# Patient Record
Sex: Female | Born: 1965 | Race: Black or African American | Hispanic: No | Marital: Single | Smoking: Never smoker
Health system: Southern US, Community
[De-identification: ages and names within clinical notes are randomized; demographics above are authoritative.]

## PROBLEM LIST (undated history)

## (undated) DIAGNOSIS — I10 Essential (primary) hypertension: Secondary | ICD-10-CM

## (undated) DIAGNOSIS — J40 Bronchitis, not specified as acute or chronic: Secondary | ICD-10-CM

## (undated) DIAGNOSIS — B029 Zoster without complications: Secondary | ICD-10-CM

## (undated) DIAGNOSIS — M25471 Effusion, right ankle: Secondary | ICD-10-CM

## (undated) DIAGNOSIS — J302 Other seasonal allergic rhinitis: Secondary | ICD-10-CM

## (undated) DIAGNOSIS — E785 Hyperlipidemia, unspecified: Secondary | ICD-10-CM

## (undated) DIAGNOSIS — J45909 Unspecified asthma, uncomplicated: Secondary | ICD-10-CM

## (undated) HISTORY — DX: Zoster without complications: B02.9

## (undated) HISTORY — PX: KNEE SURGERY: SHX244

## (undated) HISTORY — PX: LAPAROSCOPIC GASTRIC BANDING: SHX1100

## (undated) HISTORY — DX: Hyperlipidemia, unspecified: E78.5

## (undated) HISTORY — PX: ABDOMINAL HYSTERECTOMY: SHX81

## (undated) HISTORY — DX: Unspecified asthma, uncomplicated: J45.909

## (undated) HISTORY — PX: FOOT SURGERY: SHX648

---

## 2009-09-07 ENCOUNTER — Emergency Department (HOSPITAL_BASED_OUTPATIENT_CLINIC_OR_DEPARTMENT_OTHER): Admission: EM | Admit: 2009-09-07 | Discharge: 2009-09-07 | Payer: Self-pay | Admitting: Emergency Medicine

## 2009-09-07 ENCOUNTER — Ambulatory Visit: Payer: Self-pay | Admitting: Diagnostic Radiology

## 2011-10-14 ENCOUNTER — Other Ambulatory Visit: Payer: Self-pay | Admitting: Emergency Medicine

## 2011-10-14 DIAGNOSIS — M79604 Pain in right leg: Secondary | ICD-10-CM

## 2011-10-15 ENCOUNTER — Ambulatory Visit
Admission: RE | Admit: 2011-10-15 | Discharge: 2011-10-15 | Disposition: A | Discharge status: Home / Self Care | Payer: PRIVATE HEALTH INSURANCE | Source: Ambulatory Visit | Attending: Emergency Medicine | Admitting: Emergency Medicine

## 2011-10-15 DIAGNOSIS — M79604 Pain in right leg: Secondary | ICD-10-CM

## 2011-11-18 ENCOUNTER — Other Ambulatory Visit: Payer: Self-pay | Admitting: Podiatry

## 2011-11-18 DIAGNOSIS — M76829 Posterior tibial tendinitis, unspecified leg: Secondary | ICD-10-CM

## 2011-11-20 ENCOUNTER — Ambulatory Visit
Admission: RE | Admit: 2011-11-20 | Discharge: 2011-11-20 | Disposition: A | Discharge status: Home / Self Care | Payer: PRIVATE HEALTH INSURANCE | Source: Ambulatory Visit | Attending: Podiatry | Admitting: Podiatry

## 2011-11-20 DIAGNOSIS — M76829 Posterior tibial tendinitis, unspecified leg: Secondary | ICD-10-CM

## 2011-12-28 ENCOUNTER — Emergency Department (HOSPITAL_BASED_OUTPATIENT_CLINIC_OR_DEPARTMENT_OTHER)
Admission: EM | Admit: 2011-12-28 | Discharge: 2011-12-28 | Disposition: A | Discharge status: Home / Self Care | Payer: Federal, State, Local not specified - PPO | Attending: Emergency Medicine | Admitting: Emergency Medicine

## 2011-12-28 ENCOUNTER — Encounter (HOSPITAL_BASED_OUTPATIENT_CLINIC_OR_DEPARTMENT_OTHER): Payer: Self-pay | Admitting: Emergency Medicine

## 2011-12-28 DIAGNOSIS — J3489 Other specified disorders of nose and nasal sinuses: Secondary | ICD-10-CM | POA: Insufficient documentation

## 2011-12-28 DIAGNOSIS — R059 Cough, unspecified: Secondary | ICD-10-CM | POA: Insufficient documentation

## 2011-12-28 DIAGNOSIS — J40 Bronchitis, not specified as acute or chronic: Secondary | ICD-10-CM | POA: Insufficient documentation

## 2011-12-28 DIAGNOSIS — R05 Cough: Secondary | ICD-10-CM | POA: Insufficient documentation

## 2011-12-28 HISTORY — DX: Effusion, right ankle: M25.471

## 2011-12-28 HISTORY — DX: Other seasonal allergic rhinitis: J30.2

## 2011-12-28 MED ORDER — AZITHROMYCIN 250 MG PO TABS: 250.0000 mg | ORAL_TABLET | Freq: Every day | ORAL | Status: AC

## 2011-12-28 NOTE — ED Notes (Signed)
Pt having nasal congestion and cough x 6 weeks.  Pt not getting better.  No known fever.  Productive cough of clear to yellow sputum.  Some chest discomfort with coughing.  No N/V/D.  Some sneezing.

## 2011-12-28 NOTE — ED Provider Notes (Signed)
History     CSN: [id number]  Arrival date & time 12/28/11  3244   First MD Initiated Contact with Patient 12/28/11 [phone]      Chief Complaint  Patient presents with  . Nasal Congestion  . Cough    (Consider location/radiation/quality/duration/timing/severity/associated sxs/prior treatment) Patient is a 46 y.o. female presenting with cough. The history is provided by the patient.  Cough This is a new problem. Episode onset: six weeks ago. The problem occurs constantly. The problem has been gradually worsening. The cough is productive of sputum. There has been no fever. Pertinent negatives include no chest pain, no chills, no ear congestion, no rhinorrhea and no shortness of breath. She has tried decongestants and cough syrup for the symptoms. The treatment provided no relief. She is not a smoker. Her past medical history does not include pneumonia, COPD or asthma.    Past Medical History  Diagnosis Date  . Seasonal allergies   . Right ankle swelling     Past Surgical History  Procedure Date  . Abdominal hysterectomy   . Laparoscopic gastric banding     History reviewed. No pertinent family history.  History  Substance Use Topics  . Smoking status: Never Smoker   . Smokeless tobacco: Never Used  . Alcohol Use: Yes     occasional    OB History    Grav Para Term Preterm Abortions TAB SAB Ect Mult Living                  Review of Systems  Constitutional: Negative for chills.  HENT: Negative for rhinorrhea.   Respiratory: Positive for cough. Negative for shortness of breath.   Cardiovascular: Negative for chest pain.  All other systems reviewed and are negative.    Allergies  Penicillins and Iodine solution  Home Medications   Current Outpatient Rx  Name Route Sig Dispense Refill  . CETIRIZINE HCL 10 MG PO TABS Oral Take 10 mg by mouth daily.    Marland Kitchen PSEUDOEPHEDRINE-GUAIFENESIN ER 60-600 MG PO TB12 Oral Take 1 tablet by mouth every 12 (twelve) hours.       BP 159/101  Pulse 81  Temp(Src) 98.5 F (36.9 C) (Oral)  Resp 18  Ht 5' (1.524 m)  Wt 250 lb (113.399 kg)  BMI 48.82 kg/m2  SpO2 100%  Physical Exam  Nursing note and vitals reviewed. Constitutional: She is oriented to person, place, and time. She appears well-developed and well-nourished. No distress.  HENT:  Head: Normocephalic and atraumatic.  Neck: Normal range of motion. Neck supple.  Cardiovascular: Normal rate and regular rhythm.  Exam reveals no gallop and no friction rub.   No murmur heard. Pulmonary/Chest: Effort normal and breath sounds normal. No respiratory distress. She has no wheezes.  Abdominal: Soft. Bowel sounds are normal. She exhibits no distension. There is no tenderness.  Musculoskeletal: Normal range of motion.  Neurological: She is alert and oriented to person, place, and time.  Skin: Skin is warm and dry. She is not diaphoretic.    ED Course  Procedures (including critical care time)  Labs Reviewed - No data to display No results found.   No diagnosis found.    MDM  Will treat as bronchitis.  Needs to return or see pcp if not improving in one week.        Geoffery Lyons, MD 12/28/11 [phone]

## 2012-05-26 ENCOUNTER — Other Ambulatory Visit: Payer: Self-pay | Admitting: Physician Assistant

## 2012-05-26 ENCOUNTER — Ambulatory Visit
Admission: RE | Admit: 2012-05-26 | Discharge: 2012-05-26 | Disposition: A | Discharge status: Home / Self Care | Payer: Federal, State, Local not specified - PPO | Source: Ambulatory Visit | Attending: Physician Assistant | Admitting: Physician Assistant

## 2012-05-26 DIAGNOSIS — R05 Cough: Secondary | ICD-10-CM

## 2018-05-24 ENCOUNTER — Ambulatory Visit (INDEPENDENT_AMBULATORY_CARE_PROVIDER_SITE_OTHER): Payer: Federal, State, Local not specified - PPO

## 2018-05-24 ENCOUNTER — Ambulatory Visit: Payer: Federal, State, Local not specified - PPO | Admitting: Podiatry

## 2018-05-24 ENCOUNTER — Other Ambulatory Visit: Payer: Self-pay

## 2018-05-24 DIAGNOSIS — M76829 Posterior tibial tendinitis, unspecified leg: Secondary | ICD-10-CM | POA: Diagnosis not present

## 2018-05-24 DIAGNOSIS — M76821 Posterior tibial tendinitis, right leg: Secondary | ICD-10-CM | POA: Diagnosis not present

## 2018-05-24 DIAGNOSIS — M65979 Unspecified synovitis and tenosynovitis, unspecified ankle and foot: Secondary | ICD-10-CM

## 2018-05-24 DIAGNOSIS — M659 Synovitis and tenosynovitis, unspecified: Secondary | ICD-10-CM | POA: Diagnosis not present

## 2018-05-24 MED ORDER — MELOXICAM 15 MG PO TABS: 15.0000 mg | ORAL_TABLET | Freq: Every day | ORAL | 1 refills | Status: AC

## 2018-05-30 NOTE — Progress Notes (Signed)
    HPI: 52 year old female presenting today with a chief complaint of medial right ankle pain that began three months ago. She states the pain is constant and there are no modifying factors. She has not done anything for treatment. She denies any known trauma or injury. Patient is here for further evaluation and treatment.   Past Medical History:  Diagnosis Date  . Right ankle swelling   . Seasonal allergies        Physical Exam: General: The patient is alert and oriented x3 in no acute distress.  Dermatology: Skin is warm, dry and supple bilateral lower extremities. Negative for open lesions or macerations.  Vascular: Palpable pedal pulses bilaterally. No edema or erythema noted. Capillary refill within normal limits.  Neurological: Epicritic and protective threshold grossly intact bilaterally.   Musculoskeletal Exam: Pain on palpation noted to the posterior tibial tendon with medial longitudinal arch collapse noted with weightbearing. Range of motion within normal limits. Muscle strength 5/5 in all muscle groups bilateral lower extremities.  Radiographic Exam:  Normal osseous mineralization. Joint spaces preserved. No fracture or dislocation identified.    Assessment: 1. Posterior tibial tendinitis right 2. PTTD right    Plan of Care:  1. Patient was evaluated. Radiographs were reviewed today. 2. Injection of 0.5 mL Celestone Soluspan injected into the posterior tibial tendon sheath.  3. Ankle brace dispensed.  4. Prescription for Meloxicam provided to patient.  5. Return to clinic in 4 weeks.   Patient has a 14 month old grand baby she cares for.    Edrick Kins, DPM [facility]  Dr. Edrick Kins, [address]                                        [place], [place] [address]                Office [phone]  Fax [phone]

## 2018-06-30 ENCOUNTER — Ambulatory Visit (INDEPENDENT_AMBULATORY_CARE_PROVIDER_SITE_OTHER): Payer: Federal, State, Local not specified - PPO | Admitting: Podiatry

## 2018-06-30 DIAGNOSIS — M76821 Posterior tibial tendinitis, right leg: Secondary | ICD-10-CM

## 2018-06-30 DIAGNOSIS — M76829 Posterior tibial tendinitis, unspecified leg: Secondary | ICD-10-CM | POA: Diagnosis not present

## 2018-07-03 NOTE — Progress Notes (Signed)
    HPI: 52 year old female presenting today for follow up evaluation of PTTD and posterior tendinitis of the RLE. She reports some continued pain. Wearing the brace helps alleviate the pain. She now reports some swelling of the left ankle but denies any pain. She has been taking the Meloxicam and wearing the ankle brace as directed. Patient is here for further evaluation and treatment.   Past Medical History:  Diagnosis Date  . Right ankle swelling   . Seasonal allergies        Physical Exam: General: The patient is alert and oriented x3 in no acute distress.  Dermatology: Skin is warm, dry and supple bilateral lower extremities. Negative for open lesions or macerations.  Vascular: Palpable pedal pulses bilaterally. Capillary refill within normal limits.  Neurological: Epicritic and protective threshold grossly intact bilaterally.   Musculoskeletal Exam: Pain on palpation noted to the posterior tibial tendon with medial longitudinal arch collapse noted with weightbearing. Edema noted to the left ankle. Range of motion within normal limits. Muscle strength 5/5 in all muscle groups bilateral lower extremities.   Assessment: 1. Posterior tibial tendinitis right 2. PTTD right  3. Left ankle edema    Plan of Care:  1. Patient was evaluated. 2. Continue wearing ankle brace.  3. Continue taking Meloxicam daily. Patient recently finished Medrol Dose Pak.  4. Recommended good shoe gear.  5. Compression anklet dispensed for left ankle edema.  6. Return to clinic in 4 weeks.   Patient has a 55 month old grand baby she cares for.    Edrick Kins, DPM [facility]  Dr. Edrick Kins, [address]                                        [place], [place] [address]                Office [phone]  Fax [phone]

## 2018-07-28 ENCOUNTER — Ambulatory Visit: Payer: Federal, State, Local not specified - PPO | Admitting: Podiatry

## 2018-07-28 DIAGNOSIS — M76829 Posterior tibial tendinitis, unspecified leg: Secondary | ICD-10-CM | POA: Diagnosis not present

## 2018-07-28 DIAGNOSIS — M76821 Posterior tibial tendinitis, right leg: Secondary | ICD-10-CM | POA: Diagnosis not present

## 2018-07-29 ENCOUNTER — Telehealth: Payer: Self-pay | Admitting: *Deleted

## 2018-07-29 DIAGNOSIS — M76829 Posterior tibial tendinitis, unspecified leg: Secondary | ICD-10-CM

## 2018-07-29 DIAGNOSIS — M76821 Posterior tibial tendinitis, right leg: Secondary | ICD-10-CM

## 2018-07-29 NOTE — Telephone Encounter (Signed)
-----   Message from Edrick Kins, DPM sent at 07/28/2018  5:12 PM EDT ----- Regarding: MRI RLE w/out contrast Please order MRI right ankle w/out contrast.   Dx: posterior tibial tendon dysfunction RLE  Thanks, Dr. Amalia Hailey

## 2018-08-01 NOTE — Progress Notes (Signed)
    HPI: 52 year old female presenting today for follow up evaluation of PTTD and posterior tendinitis of the RLE. She states her pain has improved but only minimally. She has been wearing the ankle brace and taking the Meloxicam as directed. Standing and walking for long periods of time increases the pain. Patient is here for further evaluation and treatment.   Past Medical History:  Diagnosis Date  . Right ankle swelling   . Seasonal allergies        Physical Exam: General: The patient is alert and oriented x3 in no acute distress.  Dermatology: Skin is warm, dry and supple bilateral lower extremities. Negative for open lesions or macerations.  Vascular: Palpable pedal pulses bilaterally. Capillary refill within normal limits.  Neurological: Epicritic and protective threshold grossly intact bilaterally.   Musculoskeletal Exam: Pain on palpation noted to the posterior tibial tendon with medial longitudinal arch collapse noted with weightbearing. Edema noted to the left ankle. Range of motion within normal limits. Muscle strength 5/5 in all muscle groups bilateral lower extremities.   Assessment: 1. Posterior tibial tendinitis right - unchanged  2. PTTD right  3. Left ankle edema    Plan of Care:  1. Patient was evaluated. 2. MRI of right ankle to rule out PT tendon tear ordered.  3. Continue using ankle brace and taking Meloxicam.  4. Return to clinic in 4 weeks to review MRI results.   Patient has grand baby she cares for.    Edrick Kins, DPM [facility]  Dr. Edrick Kins, [address]                                        [place], [place] [address]                Office [phone]  Fax [phone]

## 2018-08-14 ENCOUNTER — Ambulatory Visit (HOSPITAL_BASED_OUTPATIENT_CLINIC_OR_DEPARTMENT_OTHER)
Admission: RE | Admit: 2018-08-14 | Discharge: 2018-08-14 | Disposition: A | Discharge status: Home / Self Care | Payer: Federal, State, Local not specified - PPO | Source: Ambulatory Visit | Attending: Podiatry | Admitting: Podiatry

## 2018-08-14 DIAGNOSIS — M76821 Posterior tibial tendinitis, right leg: Secondary | ICD-10-CM | POA: Diagnosis present

## 2018-08-14 DIAGNOSIS — M76829 Posterior tibial tendinitis, unspecified leg: Secondary | ICD-10-CM | POA: Diagnosis present

## 2018-08-14 DIAGNOSIS — X58XXXA Exposure to other specified factors, initial encounter: Secondary | ICD-10-CM | POA: Insufficient documentation

## 2018-08-14 DIAGNOSIS — S96811A Strain of other specified muscles and tendons at ankle and foot level, right foot, initial encounter: Secondary | ICD-10-CM | POA: Insufficient documentation

## 2018-08-23 ENCOUNTER — Ambulatory Visit (INDEPENDENT_AMBULATORY_CARE_PROVIDER_SITE_OTHER): Payer: Federal, State, Local not specified - PPO | Admitting: Podiatry

## 2018-08-23 ENCOUNTER — Ambulatory Visit: Payer: Federal, State, Local not specified - PPO | Admitting: Podiatry

## 2018-08-23 DIAGNOSIS — M76821 Posterior tibial tendinitis, right leg: Secondary | ICD-10-CM

## 2018-08-23 DIAGNOSIS — M76829 Posterior tibial tendinitis, unspecified leg: Secondary | ICD-10-CM | POA: Diagnosis not present

## 2018-08-23 MED ORDER — METHYLPREDNISOLONE 4 MG PO TBPK: ORAL_TABLET | ORAL | 0 refills | Status: DC

## 2018-08-23 NOTE — Patient Instructions (Signed)
Posterior Tibialis Tendinosis Rehab Ask your health care provider which exercises are safe for you. Do exercises exactly as told by your health care provider and adjust them as directed. It is normal to feel mild stretching, pulling, tightness, or discomfort as you do these exercises, but you should stop right away if you feel sudden pain or your pain gets worse.Do not begin these exercises until told by your health care provider. Stretching and range of motion exercises These exercises warm up your muscles and joints and improve the movement and flexibility in your ankle and foot. These exercises may also help to relieve pain. Exercise A: Standing wall calf stretch, knee straight  1. Stand with your hands against a wall. 2. Extend your __________ leg behind you, and bend your front knee slightly. Keep both of your heels on the floor. 3. Point the toes of your back foot slightly inward. 4. Keeping your heels on the floor and your back knee straight, shift your weight toward the wall. Do not allow your back to arch. You should feel a gentle stretch in the back of your lower leg (calf). 5. Hold this position for __________ seconds. Repeat __________ times. Complete this stretch __________ times a day. Exercise B: Standing wall calf stretch, knee bent 1. Stand with your hands against a wall. 2. Extend your __________ leg behind you, and bend your front knee slightly. Keep both of your heels on the floor. 3. Point the toes of your back foot slightly inward. 4. Unlock your back knee so it is bent. Keep your heels on the floor. You should feel a gentle stretch deep in your calf. 5. Hold this position for __________ seconds. Repeat __________ times. Complete this exercise __________ times a day. Strengthening exercises These exercises build strength and endurance in your ankle and foot. Endurance is the ability to use your muscles for a long time, even after they get tired. Exercise C: Ankle inversion  with band 1. Secure one end of an exercise band or tubing to a fixed object, such as a table leg or a pole, that will stay still when the band is pulled. to an object that will not move if it is pulled on, like a table leg. 2. Loop the other end of the band around the middle of your left / right foot. 3. Sit on the floor facing the object with your __________ leg extended. The band or tube should be slightly tense when your foot is relaxed. 4. Leading with your big toe, slowly bring your __________ foot and ankle inward, toward your other foot. 5. Hold this position for __________ seconds. 6. Slowly return your foot to the starting position. Repeat __________ times. Complete this exercise __________ times a day. Exercise D: Towel curls  1. Sit in a chair on a non-carpeted surface, and put your feet on the floor. 2. Place a towel in front of your feet. If told by your health care provider, add __________ at the end of the towel. 3. Keeping your heel on the floor, put your __________ foot on the towel. 4. Pull the towel toward you by grabbing the towel with your toes and curling them under. Keep your heel on the floor. 5. Let your toes relax. 6. Grab the towel with your toes again. Keep going until the towel is completely underneath your foot. Repeat __________ times. Complete this exercise __________ times a day. Balance exercises These exercises improve or maintain your balance. Balance is important in preventing falls.  Exercise E: Single leg stand 1. Without shoes, stand near a railing or in a doorway. You can hold on to the railing or door frame as needed for balance. 2. Stand on your __________ foot. Keep your big toe down on the floor and try to keep your arch lifted. If balancing in this position is too easy, try the exercise with your eyes closed or while standing on a pillow. 3. Hold this position for __________ seconds. Repeat __________ times. Complete this exercise __________ times a  day. This information is not intended to replace advice given to you by your health care provider. Make sure you discuss any questions you have with your health care provider. Document Released: 11/24/2005 Document Revised: 07/29/2016 Document Reviewed: 08/10/2015 Elsevier Interactive Patient Education  [employer].

## 2018-08-27 NOTE — Progress Notes (Signed)
Subjective: 52 year old female presents the office today for MRI results.  She has been under the care of Dr. Amalia Hailey for posterior tibial tendon dysfunction and tendinitis.  She states that she has still having pain and she points on the medial aspect ankle she has majority of symptoms.  She denies any pain to other areas of the ankle at the lateral aspect.  The pain is worse with prolonged ambulation.  She denies any recent injury or trauma. Denies any systemic complaints such as fevers, chills, nausea, vomiting. No acute changes since last appointment, and no other complaints at this time.   Objective: AAO x3, NAD DP/PT pulses palpable bilaterally, CRT less than 3 seconds There is a decrease in medial arch upon weightbearing bilaterally.  Clinically there is tenderness palpation along the flexor, posterior tibial tendon just posterior and inferior to the medial malleolus.  She is able to perform a single and double heel rise.  There is no area pinpoint tenderness.  Specifically there is no pain on the course the peroneal tendons and there is no edema to this area and overall the peroneal tendons appear to be intact.  Achilles tendon intact. No open lesions or pre-ulcerative lesions.  No pain with calf compression, swelling, warmth, erythema   MRI Right Ankle IMPRESSION: 1. Mild tendinosis of the peroneus brevis with a tiny short-segment longitudinal split tear just distal to the lateral malleolus.  Assessment: Posterior tibial tendon dysfunction, flatfoot deformity  Plan: -All treatment options discussed with the patient including all alternatives, risks, complications.  -I reviewed the MRI results with her.  Although it does show a tearing of the peroneal tendon she has no clinical symptoms of this area.  At this point I do think she will benefit from orthotics. I will have her follow-up with Liliane Channel for inserts.  We discussed gentle stretching, rehab exercises as well.  Discussed supportive  shoes.  Meloxicam as needed and continue with ankle brace for now. -Patient encouraged to call the office with any questions, concerns, change in symptoms.   Trula Slade DPM

## 2018-08-30 ENCOUNTER — Ambulatory Visit (INDEPENDENT_AMBULATORY_CARE_PROVIDER_SITE_OTHER): Payer: Federal, State, Local not specified - PPO | Admitting: Orthotics

## 2018-08-30 DIAGNOSIS — M76822 Posterior tibial tendinitis, left leg: Secondary | ICD-10-CM

## 2018-08-30 DIAGNOSIS — M76821 Posterior tibial tendinitis, right leg: Secondary | ICD-10-CM | POA: Diagnosis not present

## 2018-08-30 DIAGNOSIS — M76829 Posterior tibial tendinitis, unspecified leg: Secondary | ICD-10-CM

## 2018-08-30 NOTE — Progress Notes (Signed)
Patient presents today with a hx of PTTD/AAF.  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  Patient has medially shifted talus/navicular.  Goal is provide longitudinal arch support and RF stability.  Plan on deep heel cup, hug arch, wide foot orthosis w/ medial flange and varus correction for RF valgus deformity.  Patient educated in the progessive nature of PTTD and financial responsibility.  

## 2018-09-02 ENCOUNTER — Telehealth: Payer: Self-pay | Admitting: Podiatry

## 2018-09-02 NOTE — Telephone Encounter (Signed)
Called pt with orthotics coverage and pt is responsible for 15% after deductible(est 60.00). Pt would like to proceed with the orthotics.

## 2018-09-23 ENCOUNTER — Ambulatory Visit: Payer: Federal, State, Local not specified - PPO | Admitting: Orthotics

## 2018-09-23 DIAGNOSIS — M76829 Posterior tibial tendinitis, unspecified leg: Secondary | ICD-10-CM

## 2018-09-23 DIAGNOSIS — M76821 Posterior tibial tendinitis, right leg: Secondary | ICD-10-CM

## 2018-09-28 NOTE — Progress Notes (Signed)
Patient came in today to pick up custom made foot orthotics.  The goals were accomplished and the patient reported no dissatisfaction with said orthotics.  Patient was advised of breakin period and how to report any issues. 

## 2018-10-05 ENCOUNTER — Ambulatory Visit: Payer: Federal, State, Local not specified - PPO | Admitting: Podiatry

## 2018-10-05 ENCOUNTER — Encounter: Payer: Self-pay | Admitting: Podiatry

## 2018-10-05 ENCOUNTER — Other Ambulatory Visit: Payer: Self-pay

## 2018-10-05 DIAGNOSIS — M76821 Posterior tibial tendinitis, right leg: Secondary | ICD-10-CM | POA: Diagnosis not present

## 2018-10-05 MED ORDER — METHYLPREDNISOLONE 4 MG PO TBPK: ORAL_TABLET | ORAL | 0 refills | Status: DC

## 2018-10-11 NOTE — Progress Notes (Signed)
Subjective: 52 year old female presents the office today for follow-up evaluation of right ankle pain.  She states that her ankles are the same she still having some swelling to the area.  She does not like the orthotics that she feels the arch is too high she has difficulty finding shoes.  She tried to wear the cam boot because her back hurts that she stopped wearing it.  She did not bring her orthotics with her today. Denies any systemic complaints such as fevers, chills, nausea, vomiting. No acute changes since last appointment, and no other complaints at this time.   Objective: AAO x3, NAD DP/PT pulses palpable bilaterally, CRT less than 3 seconds There is edema to the medial aspect of the ankle on the right side.  There is tenderness palpation of the flexor tendons, posterior tibial tendon.  Overall tendons appear to be intact there is no erythema or warmth.  No pain with ankle joint range of motion subtalar joint range of motion.  No other areas of tenderness. No open lesions or pre-ulcerative lesions.  No pain with calf compression, swelling, warmth, erythema  Assessment: 52 year old female with PTTD, flatfoot  Plan: -All treatment options discussed with the patient including all alternatives, risks, complications.  -Overall doing well around the most of given the swelling and pain.  Recommend ankle brace.  We will try to contact radiology in regards to the MRI for another look at it.  On her to come back with her orthotics that where he can try to modify them for her. -Patient encouraged to call the office with any questions, concerns, change in symptoms.   Trula Slade DPM

## 2018-10-15 ENCOUNTER — Encounter: Payer: Self-pay | Admitting: Podiatry

## 2018-10-15 NOTE — Progress Notes (Signed)
I called radiology to discuss MRI results. There is fluid in the PT tendon, but no tear. Deltoid ligament is intact. No bone lesions. FDL and FHL intact with no significant fluid.   I called her to discuss the results. Will start PT, sent to [facility]. Discussed other options but will start with PT.   Monique Mata

## 2018-10-18 NOTE — Addendum Note (Signed)
Addended by: Cranford Mon R on: 10/18/2018 07:47 AM   Modules accepted: Orders

## 2018-11-02 ENCOUNTER — Other Ambulatory Visit: Payer: Self-pay | Admitting: Podiatry

## 2018-12-22 ENCOUNTER — Emergency Department (HOSPITAL_COMMUNITY): Payer: Federal, State, Local not specified - PPO

## 2018-12-22 ENCOUNTER — Encounter (HOSPITAL_COMMUNITY): Payer: Self-pay | Admitting: Emergency Medicine

## 2018-12-22 ENCOUNTER — Other Ambulatory Visit: Payer: Self-pay

## 2018-12-22 ENCOUNTER — Emergency Department (HOSPITAL_COMMUNITY)
Admission: EM | Admit: 2018-12-22 | Discharge: 2018-12-22 | Disposition: A | Discharge status: Home / Self Care | Payer: Federal, State, Local not specified - PPO | Attending: Emergency Medicine | Admitting: Emergency Medicine

## 2018-12-22 DIAGNOSIS — R05 Cough: Secondary | ICD-10-CM | POA: Diagnosis present

## 2018-12-22 DIAGNOSIS — J40 Bronchitis, not specified as acute or chronic: Secondary | ICD-10-CM | POA: Diagnosis not present

## 2018-12-22 HISTORY — DX: Bronchitis, not specified as acute or chronic: J40

## 2018-12-22 LAB — BASIC METABOLIC PANEL
Anion gap: 9 (ref 5–15)
BUN: 16 mg/dL (ref 6–20)
CALCIUM: 8.4 mg/dL — AB (ref 8.9–10.3)
CO2: 27 mmol/L (ref 22–32)
CREATININE: 1.02 mg/dL — AB (ref 0.44–1.00)
Chloride: 105 mmol/L (ref 98–111)
GFR calc non Af Amer: >60 mL/min (ref >60)
Glucose, Bld: 136 mg/dL — ABNORMAL HIGH (ref 70–99)
Potassium: 3.4 mmol/L — ABNORMAL LOW (ref 3.5–5.1)
SODIUM: 141 mmol/L (ref 135–145)

## 2018-12-22 LAB — CBC
HCT: 44.4 % (ref 36.0–46.0)
Hemoglobin: 13.8 g/dL (ref 12.0–15.0)
MCH: 29.5 pg (ref 26.0–34.0)
MCHC: 31.1 g/dL (ref 30.0–36.0)
MCV: 94.9 fL (ref 80.0–100.0)
NRBC: 0 % (ref 0.0–0.2)
PLATELETS: 267 10*3/uL (ref 150–400)
RBC: 4.68 MIL/uL (ref 3.87–5.11)
RDW: 13.8 % (ref 11.5–15.5)
WBC: 8.3 10*3/uL (ref 4.0–10.5)

## 2018-12-22 LAB — I-STAT TROPONIN, ED: Troponin i, poc: 0 ng/mL (ref 0.00–0.08)

## 2018-12-22 LAB — I-STAT BETA HCG BLOOD, ED (MC, WL, AP ONLY)

## 2018-12-22 MED ORDER — ALBUTEROL SULFATE HFA 108 (90 BASE) MCG/ACT IN AERS
2.0000 | INHALATION_SPRAY | Freq: Once | RESPIRATORY_TRACT | Status: AC
  Administered 2018-12-22: 2 via RESPIRATORY_TRACT
  Filled 2018-12-22: qty 6.7

## 2018-12-22 MED ORDER — AEROCHAMBER PLUS FLO-VU LARGE MISC
1.0000 | Freq: Once | Status: AC
  Administered 2018-12-22: 1

## 2018-12-22 MED ORDER — SODIUM CHLORIDE 0.9% FLUSH: 3.0000 mL | Freq: Once | INTRAVENOUS | Status: DC

## 2018-12-22 MED ORDER — PREDNISONE 10 MG PO TABS: ORAL_TABLET | ORAL | 0 refills | Status: DC

## 2018-12-22 MED ORDER — IOPAMIDOL (ISOVUE-370) INJECTION 76%
100.0000 mL | Freq: Once | INTRAVENOUS | Status: AC | PRN
  Administered 2018-12-22: 100 mL via INTRAVENOUS

## 2018-12-22 MED ORDER — ALBUTEROL (5 MG/ML) CONTINUOUS INHALATION SOLN
10.0000 mg/h | INHALATION_SOLUTION | Freq: Once | RESPIRATORY_TRACT | Status: AC
  Administered 2018-12-22: 10 mg/h via RESPIRATORY_TRACT
  Filled 2018-12-22: qty 20

## 2018-12-22 NOTE — ED Provider Notes (Signed)
[facility] Provider Note   CSN: [id number] Arrival date & time: 12/22/18  1531     History   Chief Complaint Chief Complaint  Patient presents with  . elevated D-dimer    HPI Monique Mata is a 53 y.o. female.  HPI   She has been ill for about a month with cough, not responsive to 2 courses of doxycycline.  She is also using cough suppressants without relief.  Has a history of asthma and is continue to use an albuterol inhaler, without relief.  Today she had blood work done which showed elevated d-dimer and her PCP ordered a CT angiogram.  Because of a concern for iodine allergy she was sent to the emergency department.  He has never had an IV contrast allergy that is known.  She denies fever, chills, weakness or dizziness.  She is not a smoker.  She noticed a rash on her face 3 days ago which is pruritic and persistent.  Factors.  Past Medical History:  Diagnosis Date  . Bronchitis   . Right ankle swelling   . Seasonal allergies     There are no active problems to display for this patient.   Past Surgical History:  Procedure Laterality Date  . ABDOMINAL HYSTERECTOMY    . KNEE SURGERY    . LAPAROSCOPIC GASTRIC BANDING       OB History   No obstetric history on file.      Home Medications    Prior to Admission medications   Medication Sig Start Date End Date Taking? Authorizing Provider  albuterol (PROVENTIL HFA;VENTOLIN HFA) 108 (90 Base) MCG/ACT inhaler Inhale 2 puffs into the lungs every 6 (six) hours as needed for shortness of breath. 12/13/18  Yes [provider]  cetirizine (ZYRTEC) 10 MG tablet Take 10 mg by mouth daily as needed for allergies.    Yes [provider]  doxycycline (VIBRAMYCIN) 100 MG capsule Take 100 mg by mouth 2 (two) times daily. For ten days Started on 12-20-17 12/20/18  Yes [provider]  guaiFENesin-codeine (ROBITUSSIN AC) 100-10 MG/5ML syrup Take 10 mLs by mouth 4  (four) times daily as needed for cough. 12/20/18 12/30/18 Yes [provider]  ibuprofen (ADVIL,MOTRIN) 800 MG tablet Take 800 mg by mouth every 8 (eight) hours as needed for headache or mild pain.  08/12/18  Yes [provider]  benzonatate (TESSALON) 100 MG capsule Take 100 mg by mouth 3 (three) times daily as needed for cough.    [provider]  predniSONE (DELTASONE) 10 MG tablet Take q day 6,6,5,5,4,4,3,3,2,2,1,1 12/22/18   Daleen Bo, MD    Family History No family history on file.  Social History Social History   Tobacco Use  . Smoking status: Never Smoker  . Smokeless tobacco: Never Used  Substance Use Topics  . Alcohol use: Yes    Comment: occasional  . Drug use: Never     Allergies   Fish allergy; Peanut-containing drug products; Penicillins; Triptans; and Iodine solution [povidone iodine]   Review of Systems Review of Systems  All other systems reviewed and are negative.    Physical Exam Updated Vital Signs BP (!) 160/84   Pulse (!) 116   Temp 98.6 F (37 C) (Oral)   Resp 19   SpO2 94%   Physical Exam Vitals signs and nursing note reviewed.  Constitutional:      General: She is in acute distress (Coughing).     Appearance: She  is well-developed. She is obese. She is not ill-appearing, toxic-appearing or diaphoretic.  HENT:     Head: Normocephalic and atraumatic.     Right Ear: External ear normal.     Left Ear: External ear normal.     Nose: Nose normal. No congestion or rhinorrhea.     Mouth/Throat:     Mouth: Mucous membranes are moist.     Pharynx: Oropharynx is clear.  Eyes:     Conjunctiva/sclera: Conjunctivae normal.     Pupils: Pupils are equal, round, and reactive to light.  Neck:     Musculoskeletal: Normal range of motion and neck supple.     Trachea: Phonation normal.  Cardiovascular:     Rate and Rhythm: Normal rate and regular rhythm.     Heart sounds: Normal heart sounds.  Pulmonary:     Effort:  Pulmonary effort is normal. No respiratory distress.     Breath sounds: No stridor.     Comments: Mild decreased air movement bilaterally with scattered wheezes.  Nonproductive cough during exam. Abdominal:     General: There is no distension.     Palpations: Abdomen is soft.     Tenderness: There is no abdominal tenderness.  Musculoskeletal: Normal range of motion.  Skin:    General: Skin is warm and dry.     Comments: Red raised rash face, scattered papules, without petechiae, vesicles or drainage.  Neurological:     Mental Status: She is alert and oriented to person, place, and time.     Cranial Nerves: No cranial nerve deficit.     Sensory: No sensory deficit.     Motor: No abnormal muscle tone.     Coordination: Coordination normal.  Psychiatric:        Behavior: Behavior normal.        Thought Content: Thought content normal.        Judgment: Judgment normal.      ED Treatments / Results  Labs (all labs ordered are listed, but only abnormal results are displayed) Labs Reviewed  BASIC METABOLIC PANEL - Abnormal; Notable for the following components:      Result Value   Potassium 3.4 (*)    Glucose, Bld 136 (*)    Creatinine, Ser 1.02 (*)    Calcium 8.4 (*)    All other components within normal limits  CBC  I-STAT TROPONIN, ED  I-STAT BETA HCG BLOOD, ED ([facility], [facility], [facility] ONLY)    EKG EKG Interpretation  Date/Time:  Wednesday December 22 2018 16:04:46 EST Ventricular Rate:  102 PR Interval:  140 QRS Duration: 76 QT Interval:  340 QTC Calculation: 443 R Axis:   -21 Text Interpretation:  Sinus tachycardia Moderate voltage criteria for LVH, may be normal variant Borderline ECG No old tracing to compare Confirmed by Daleen Bo [phone]) on 12/22/2018 11:09:15 PM   Radiology Dg Chest 2 View  Result Date: 12/22/2018 CLINICAL DATA:  Cough with shortness of breath for 1 month. Elevated D-dimer levels. Nonsmoker. EXAM: CHEST - 2 VIEW COMPARISON:  Radiographs 05/26/2012.  FINDINGS: The heart size and mediastinal contours are normal. The lungs are clear. There is no pleural effusion or pneumothorax. No acute osseous findings are identified. Laparoscopic gastric band noted. IMPRESSION: Stable chest.  No active cardiopulmonary process. Electronically Signed   By: Richardean Sale M.D.   On: 12/22/2018 17:15   Ct Angio Chest Pe W And/or Wo Contrast  Result Date: 12/22/2018 CLINICAL DATA:  53 year old female with productive cough, shortness of breath  and chest discomfort. Abnormal D-dimer. EXAM: CT ANGIOGRAPHY CHEST WITH CONTRAST TECHNIQUE: Multidetector CT imaging of the chest was performed using the standard protocol during bolus administration of intravenous contrast. Multiplanar CT image reconstructions and MIPs were obtained to evaluate the vascular anatomy. CONTRAST:  168mL ISOVUE-370 IOPAMIDOL (ISOVUE-370) INJECTION 76% COMPARISON:  Chest radiographs earlier today. FINDINGS: Cardiovascular: Good contrast bolus timing in the pulmonary arterial tree. No focal filling defect identified in the pulmonary arteries to suggest acute pulmonary embolism. Cardiac size at the upper limits of normal to mildly enlarged. No pericardial effusion. Negative visible aorta. Mediastinum/Nodes: Negative. No lymphadenopathy. Lungs/Pleura: Major airways are patent. Questionable bilateral lower lobe peribronchial thickening (series 6, image 64). Mild mosaic attenuation in the lower lobe such as due to gas trapping. No pleural effusion. No abnormal pulmonary opacity. Upper Abdomen: Partially visible gastric band. Negative visible liver, spleen, adrenal glands, kidneys, splenic flexure. Musculoskeletal: No acute osseous abnormality identified. Review of the MIP images confirms the above findings. IMPRESSION: 1. Negative for acute pulmonary embolus. 2. Suspect bilateral lower lobe peribronchial thickening and gas trapping. Consider bronchitis/airway disease. No pleural effusion or pneumonia. Electronically  Signed   By: Genevie Ann M.D.   On: 12/22/2018 21:14    Procedures Procedures (including critical care time)  Medications Ordered in ED Medications  sodium chloride flush (NS) 0.9 % injection 3 mL (has no administration in time range)  albuterol (PROVENTIL HFA;VENTOLIN HFA) 108 (90 Base) MCG/ACT inhaler 2 puff (has no administration in time range)  AEROCHAMBER PLUS FLO-VU LARGE MISC 1 each (has no administration in time range)  albuterol (PROVENTIL,VENTOLIN) solution continuous neb (10 mg/hr Nebulization Given 12/22/18 2124)  iopamidol (ISOVUE-370) 76 % injection 100 mL (100 mLs Intravenous Contrast Given 12/22/18 2056)     Initial Impression / Assessment and Plan / ED Course  I have reviewed the triage vital signs and the nursing notes.  Pertinent labs & imaging results that were available during my care of the patient were reviewed by me and considered in my medical decision making (see chart for details).  Clinical Course as of Dec 22 2340  Wed Dec 22, 2018  2307 Normal  I-stat troponin, ED [EW]  2307 Normal except potassium low, glucose high, creatinine high, calcium low  Basic metabolic panel(!) [EW]  1751 Normal  I-Stat beta hCG blood, ED [EW]  2308 Normal  CBC [EW]  2308 No PE.  Consistent with bronchitis, no evidence for pneumonia.  Images reviewed by me  CT Angio Chest PE W and/or Wo Contrast [EW]  2308 No pneumonia or CHF, images reviewed by me  DG Chest 2 View [EW]    Clinical Course User Index [EW] Daleen Bo, MD     Patient Vitals for the past 24 hrs:  BP Temp Temp src Pulse Resp SpO2  12/22/18 2330 (!) 160/84 - - (!) 116 - 94 %  12/22/18 2300 (!) 125/92 - - (!) 126 - 92 %  12/22/18 2245 (!) 141/78 - - (!) 130 19 95 %  12/22/18 2230 (!) 150/87 - - (!) 137 (!) 24 100 %  12/22/18 2215 (!) 152/79 - - (!) 123 19 99 %  12/22/18 2200 (!) 154/69 - - (!) 116 19 100 %  12/22/18 2144 (!) 157/73 - - 93 16 100 %  12/22/18 2128 - - - - - 98 %  12/22/18 1559 (!)  163/113 98.6 F (37 C) Oral (!) 113 (!) 22 97 %    11:40 PM Reevaluation with update and  discussion. After initial assessment and treatment, an updated evaluation reveals she feels she did not get any improvement from the albuterol nebulizer.  Findings discussed with patient and family members, all questions answered. Daleen Bo   Medical Decision Making: Evaluation consistent with bronchitis.  No apparent PE.  Patient with nonspecific facial rash, possibly indicating allergic component to this illness.  Oxygenation normal on room air.  Occasion for admission at this time.  Patient has Zyrtec at home to take which may help her symptoms.  Doubt ACS, PE or pneumonia.  CRITICAL CARE-no Performed by: Daleen Bo  Nursing Notes Reviewed/ Care Coordinated Applicable Imaging Reviewed Interpretation of Laboratory Data incorporated into ED treatment  The patient appears reasonably screened and/or stabilized for discharge and I doubt any other medical condition or other [facility] requiring further screening, evaluation, or treatment in the ED at this time prior to discharge.  Plan: Home Medications-continue usual medicines; Home Treatments-rest, fluids, crease oral water intake; return here if the recommended treatment, does not improve the symptoms; Recommended follow up-pulmonary follow-up as soon as possible for further evaluation.   Final Clinical Impressions(s) / ED Diagnoses   Final diagnoses:  Bronchitis    ED Discharge Orders         Ordered    predniSONE (DELTASONE) 10 MG tablet     12/22/18 2337    Ambulatory referral to Pulmonology     12/22/18 2338           Daleen Bo, MD 12/22/18 2342

## 2018-12-22 NOTE — ED Triage Notes (Signed)
C/o sob, productive cough with yellow phlegm, and chest discomfort (she believes from coughing) x 2 weeks.  Seen by PCP and had positive D-dimer.  Sent to [facility] for CT but pt has allergy to IV contrast and was instructed to come to ED.

## 2018-12-22 NOTE — Discharge Instructions (Addendum)
Use the inhaler 2 puffs every 3-4 hours for cough or trouble breathing.  Use your Zyrtec to help the rash and possibly improve lung function.  We sent a prescription for a tapered dose of prednisone to your pharmacy.  We sent a referral to the pulmonary office, to get further evaluation and treatment for your symptoms.  Make sure that you are drinking plenty of water, to improve your relative to control secretions.  Return here, if needed, for problems.

## 2018-12-22 NOTE — ED Notes (Signed)
Patient verbalizes understanding of medications and discharge instructions. No further questions at this time. VSS and patient ambulatory at discharge.   

## 2018-12-22 NOTE — ED Notes (Signed)
Dr. Ashok Cordia notified of pt and orders received for CT.

## 2018-12-24 ENCOUNTER — Ambulatory Visit: Payer: Federal, State, Local not specified - PPO | Admitting: Pulmonary Disease

## 2018-12-24 ENCOUNTER — Encounter: Payer: Self-pay | Admitting: Pulmonary Disease

## 2018-12-24 VITALS — BP 142/84 | HR 98 | Ht 60.0 in | Wt 257.4 lb

## 2018-12-24 DIAGNOSIS — R0602 Shortness of breath: Secondary | ICD-10-CM | POA: Diagnosis not present

## 2018-12-24 DIAGNOSIS — J4521 Mild intermittent asthma with (acute) exacerbation: Secondary | ICD-10-CM | POA: Insufficient documentation

## 2018-12-24 DIAGNOSIS — R05 Cough: Secondary | ICD-10-CM | POA: Diagnosis not present

## 2018-12-24 DIAGNOSIS — R059 Cough, unspecified: Secondary | ICD-10-CM | POA: Insufficient documentation

## 2018-12-24 DIAGNOSIS — R058 Other specified cough: Secondary | ICD-10-CM

## 2018-12-24 MED ORDER — ALBUTEROL SULFATE (2.5 MG/3ML) 0.083% IN NEBU
2.5000 mg | INHALATION_SOLUTION | Freq: Once | RESPIRATORY_TRACT | Status: AC
  Administered 2018-12-24: 2.5 mg via RESPIRATORY_TRACT

## 2018-12-24 MED ORDER — BUDESONIDE-FORMOTEROL FUMARATE 160-4.5 MCG/ACT IN AERO: 2.0000 | INHALATION_SPRAY | Freq: Two times a day (BID) | RESPIRATORY_TRACT | 0 refills | Status: DC

## 2018-12-24 MED ORDER — HYDROCODONE-HOMATROPINE 5-1.5 MG/5ML PO SYRP: 5.0000 mL | ORAL_SOLUTION | Freq: Four times a day (QID) | ORAL | 0 refills | Status: DC | PRN

## 2018-12-24 MED ORDER — ALBUTEROL SULFATE (2.5 MG/3ML) 0.083% IN NEBU: 2.5000 mg | INHALATION_SOLUTION | RESPIRATORY_TRACT | 5 refills | Status: DC | PRN

## 2018-12-24 NOTE — Progress Notes (Signed)
Patient seen in the office today and instructed on use of Symbicort.  Patient expressed understanding and demonstrated technique. ° °

## 2018-12-24 NOTE — Progress Notes (Signed)
Synopsis: Referred in Jan 2020 for cough by Daleen Bo, MD  Subjective:   PATIENT ID: Monique Mata GENDER: female DOB: 02/15/1966, MRN: [id number]  Chief Complaint  Patient presents with  . Consult    Consult for SOB and Bronchitis.     C/o cough that has been going on for the past 8 weeks. Her granddaughter is in daycare and she got a URI. It justhasnt gotten better. Over the past several weeks she has been unable to work due to coughing. She coughs so much she vomits on occasions. She was diagnosed with asthma 10 years ago. She wheezes on occasion. She uses albuterol PRN.  She has been out of work since Monday.  Her cough is dry and at this point is making her voice hoarse.  She feels like she has picked up both upper respiratory tract infections that she has had in the past couple of months from her granddaughter who is in daycare.  She is not sleeping because of the cough.  She is currently only sleeping approximately 3 to 4 hours.  She denies hemoptysis.  She was recently seen in the emergency room due to chest tightness and wheezing.  They thought the other night thereafter called EMS because she could not catch her breath.    Past Medical History:  Diagnosis Date  . Bronchitis   . Right ankle swelling   . Seasonal allergies      Family History  Problem Relation Age of Onset  . Cancer Mother   . Asthma Father   . Hypertension Father      Past Surgical History:  Procedure Laterality Date  . ABDOMINAL HYSTERECTOMY    . KNEE SURGERY    . LAPAROSCOPIC GASTRIC BANDING      Social History   Socioeconomic History  . Marital status: Single    Spouse name: Not on file  . Number of children: Not on file  . Years of education: Not on file  . Highest education level: Not on file  Occupational History  . Not on file  Social Needs  . Financial resource strain: Not on file  . Food insecurity:    Worry: Not on file    Inability: Not on file  . Transportation needs:      Medical: Not on file    Non-medical: Not on file  Tobacco Use  . Smoking status: Never Smoker  . Smokeless tobacco: Never Used  Substance and Sexual Activity  . Alcohol use: Yes    Comment: occasional  . Drug use: Never  . Sexual activity: Not on file  Lifestyle  . Physical activity:    Days per week: Not on file    Minutes per session: Not on file  . Stress: Not on file  Relationships  . Social connections:    Talks on phone: Not on file    Gets together: Not on file    Attends religious service: Not on file    Active member of club or organization: Not on file    Attends meetings of clubs or organizations: Not on file    Relationship status: Not on file  . Intimate partner violence:    Fear of current or ex partner: Not on file    Emotionally abused: Not on file    Physically abused: Not on file    Forced sexual activity: Not on file  Other Topics Concern  . Not on file  Social History Narrative  . Not on  file     Allergies  Allergen Reactions  . Fish Allergy Anaphylaxis  . Peanut-Containing Drug Products Anaphylaxis  . Penicillins Anaphylaxis and Other (See Comments)    Did it involve swelling of the face/tongue/throat, SOB, or low BP? Yes Did it involve sudden or severe rash/hives, skin peeling, or any reaction on the inside of your mouth or nose? Yes Did you need to seek medical attention at a hospital or doctor's office? Yes When did it last happen?childhood If all above answers are "NO", may proceed with cephalosporin use.   . Triptans Other (See Comments)    unk  . Iodine Solution [Povidone Iodine] Rash     Outpatient Medications Prior to Visit  Medication Sig Dispense Refill  . albuterol (PROVENTIL HFA;VENTOLIN HFA) 108 (90 Base) MCG/ACT inhaler Inhale 2 puffs into the lungs every 6 (six) hours as needed for shortness of breath.    . benzonatate (TESSALON) 100 MG capsule Take 100 mg by mouth 3 (three) times daily as needed for cough.    .  cetirizine (ZYRTEC) 10 MG tablet Take 10 mg by mouth daily as needed for allergies.     Marland Kitchen doxycycline (VIBRAMYCIN) 100 MG capsule Take 100 mg by mouth 2 (two) times daily. For ten days Started on 12-20-17    . guaiFENesin-codeine (ROBITUSSIN AC) 100-10 MG/5ML syrup Take 10 mLs by mouth 4 (four) times daily as needed for cough.    Marland Kitchen ibuprofen (ADVIL,MOTRIN) 800 MG tablet Take 800 mg by mouth every 8 (eight) hours as needed for headache or mild pain.   0  . predniSONE (DELTASONE) 10 MG tablet Take q day 6,6,5,5,4,4,3,3,2,2,1,1 42 tablet 0   No facility-administered medications prior to visit.     Review of Systems  Constitutional: Negative for chills, fever, malaise/fatigue and weight loss.  HENT: Negative for hearing loss, sore throat and tinnitus.   Eyes: Negative for blurred vision and double vision.  Respiratory: Positive for cough, shortness of breath and wheezing. Negative for hemoptysis, sputum production and stridor.   Cardiovascular: Negative for chest pain, palpitations, orthopnea, leg swelling and PND.  Gastrointestinal: Negative for abdominal pain, constipation, diarrhea, heartburn, nausea and vomiting.  Genitourinary: Negative for dysuria, hematuria and urgency.  Musculoskeletal: Negative for joint pain and myalgias.  Skin: Negative for itching and rash.  Neurological: Negative for dizziness, tingling, weakness and headaches.  Endo/Heme/Allergies: Negative for environmental allergies. Does not bruise/bleed easily.  Psychiatric/Behavioral: Negative for depression. The patient is not nervous/anxious and does not have insomnia.   All other systems reviewed and are negative.    Objective:  Physical Exam Vitals signs reviewed.  Constitutional:      General: She is not in acute distress.    Appearance: She is well-developed.  HENT:     Head: Normocephalic and atraumatic.  Eyes:     General: No scleral icterus.    Conjunctiva/sclera: Conjunctivae normal.     Pupils: Pupils are  equal, round, and reactive to light.  Neck:     Musculoskeletal: Neck supple.     Vascular: No JVD.     Trachea: No tracheal deviation.  Cardiovascular:     Rate and Rhythm: Normal rate and regular rhythm.     Heart sounds: Normal heart sounds. No murmur.  Pulmonary:     Effort: Pulmonary effort is normal. No tachypnea, accessory muscle usage or respiratory distress.     Breath sounds: No stridor. Wheezing present. No rhonchi or rales.     Comments: Minimal air movement bilaterally  Abdominal:     General: Bowel sounds are normal. There is no distension.     Palpations: Abdomen is soft.     Tenderness: There is no abdominal tenderness.  Musculoskeletal:        General: No tenderness.  Lymphadenopathy:     Cervical: No cervical adenopathy.  Skin:    General: Skin is warm and dry.     Capillary Refill: Capillary refill takes less than 2 seconds.     Findings: No rash.  Neurological:     Mental Status: She is alert and oriented to person, place, and time.  Psychiatric:        Behavior: Behavior normal.      Vitals:   12/24/18 1139  BP: (!) 142/84  Pulse: 98  SpO2: 98%  Weight: 257 lb 6.4 oz (116.8 kg)  Height: 5' (1.524 m)   98% on RA BMI Readings from Last 3 Encounters:  12/24/18 50.27 kg/m  12/28/11 48.82 kg/m   Wt Readings from Last 3 Encounters:  12/24/18 257 lb 6.4 oz (116.8 kg)  12/28/11 250 lb (113.4 kg)     CBC    Component Value Date/Time   WBC 8.3 12/22/2018 1603   RBC 4.68 12/22/2018 1603   HGB 13.8 12/22/2018 1603   HCT 44.4 12/22/2018 1603   PLT 267 12/22/2018 1603   MCV 94.9 12/22/2018 1603   MCH 29.5 12/22/2018 1603   MCHC 31.1 12/22/2018 1603   RDW 13.8 12/22/2018 1603    Chest Imaging: 12/22/2018: Chest x-ray and CT chest CTA with no evidence of PE Bilateral peribronchial thickening and areas of air trapping consistent with small airway disease. The patient's images have been independently reviewed by me.   Pulmonary Functions  Testing Results: No flowsheet data found.  FeNO: None   Pathology: None   Echocardiogram: None   Heart Catheterization: None     Assessment & Plan:   Shortness of breath - Plan: Ambulatory Referral for DME, albuterol (PROVENTIL) (2.5 MG/3ML) 0.083% nebulizer solution 2.5 mg  Mild intermittent asthma with exacerbation  Coughing  Discussion: This is a 53 year old morbidly obese female with recent upper respiratory tract infection.  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  She was diagnosed as asthma greater than 10 years ago.  We will treat her with the following: Give her an albuterol treatment today in the office She can continue her albuterol as needed for shortness of breath and wheezing We will also give her albuterol nebulizer machine for home and albuterol solution. She needs to continue the steroid taper that she was placed on in the ER. We will also give her some Hycodan cough syrup.  She is having very significant coughing even today in the office that she is unable to catch her breath. We also checked the PMP which was clear. Prescription was sent via [facility] Patient was given a sample of Symbicort 160, 2 puffs twice daily. She was instructed how to use the inhaler. Patient to follow-up in clinic with [place] in approximately 2 weeks to check to see how she is doing.  She can see 1 of our nurse practitioners in follow-up.  Greater than 50% of this patient's 30-minute office visit was been face-to-face discussing the recommendations and treatment plan.    Current Outpatient Medications:  .  albuterol (PROVENTIL HFA;VENTOLIN HFA) 108 (90 Base) MCG/ACT inhaler, Inhale 2 puffs into the lungs every 6 (six) hours as needed for shortness of  breath., Disp: , Rfl:  .  benzonatate (TESSALON) 100 MG capsule, Take 100 mg by mouth 3 (three) times daily as needed for cough., Disp: , Rfl:  .  cetirizine (ZYRTEC) 10 MG  tablet, Take 10 mg by mouth daily as needed for allergies. , Disp: , Rfl:  .  doxycycline (VIBRAMYCIN) 100 MG capsule, Take 100 mg by mouth 2 (two) times daily. For ten days Started on 12-20-17, Disp: , Rfl:  .  guaiFENesin-codeine (ROBITUSSIN AC) 100-10 MG/5ML syrup, Take 10 mLs by mouth 4 (four) times daily as needed for cough., Disp: , Rfl:  .  ibuprofen (ADVIL,MOTRIN) 800 MG tablet, Take 800 mg by mouth every 8 (eight) hours as needed for headache or mild pain. , Disp: , Rfl: 0 .  predniSONE (DELTASONE) 10 MG tablet, Take q day 6,6,5,5,4,4,3,3,2,2,1,1, Disp: 42 tablet, Rfl: 0 .  albuterol (PROVENTIL) (2.5 MG/3ML) 0.083% nebulizer solution, Take 3 mLs (2.5 mg total) by nebulization every 4 (four) hours as needed for wheezing or shortness of breath., Disp: 75 mL, Rfl: 5 .  budesonide-formoterol (SYMBICORT) 160-4.5 MCG/ACT inhaler, Inhale 2 puffs into the lungs every 12 (twelve) hours., Disp: 1 Inhaler, Rfl: 0 .  HYDROcodone-homatropine (HYCODAN) 5-1.5 MG/5ML syrup, Take 5 mLs by mouth every 6 (six) hours as needed for cough., Disp: 240 mL, Rfl: 0   Garner Nash, DO [facility] 12/24/2018 12:16 PM

## 2018-12-24 NOTE — Patient Instructions (Addendum)
Thank you for visiting Dr. Valeta Harms at [facility]. Today we recommend the following: Orders Placed This Encounter  Procedures  . Ambulatory Referral for DME   Meds ordered this encounter  Medications  . budesonide-formoterol (SYMBICORT) 160-4.5 MCG/ACT inhaler    Sig: Inhale 2 puffs into the lungs every 12 (twelve) hours.    Dispense:  1 Inhaler    Refill:  0  . albuterol (PROVENTIL) (2.5 MG/3ML) 0.083% nebulizer solution    Sig: Take 3 mLs (2.5 mg total) by nebulization every 4 (four) hours as needed for wheezing or shortness of breath.    Dispense:  75 mL    Refill:  5   Return in about 3 weeks (around 01/14/2019). Please schedule F/U with NP.

## 2019-01-14 ENCOUNTER — Telehealth: Payer: Self-pay

## 2019-01-14 NOTE — Telephone Encounter (Signed)
Call made to patient, sleep consult appt made. Cancelled F/U with NP. She states she is feeling much better and does not feel like she needs to be seen. She states she would like to be called in about 3 months. Recall placed in computer. Will route to BI as FYI. Nothing further is needed at this time.

## 2019-01-14 NOTE — Telephone Encounter (Signed)
PCCM: Thanks for the update.  Garner Nash, DO [facility] 01/14/2019 10:51 AM

## 2019-01-17 ENCOUNTER — Encounter: Payer: Self-pay | Admitting: Pulmonary Disease

## 2019-01-17 ENCOUNTER — Ambulatory Visit: Payer: Federal, State, Local not specified - PPO | Admitting: Pulmonary Disease

## 2019-01-17 ENCOUNTER — Ambulatory Visit: Payer: Federal, State, Local not specified - PPO | Admitting: Nurse Practitioner

## 2019-01-17 VITALS — BP 154/110 | HR 80 | Ht 64.0 in | Wt 256.8 lb

## 2019-01-17 DIAGNOSIS — G4733 Obstructive sleep apnea (adult) (pediatric): Secondary | ICD-10-CM | POA: Diagnosis not present

## 2019-01-17 DIAGNOSIS — Z87898 Personal history of other specified conditions: Secondary | ICD-10-CM | POA: Diagnosis not present

## 2019-01-17 NOTE — Patient Instructions (Signed)
History of obstructive sleep apnea  We will set you up for home sleep study We will refer you to a dentist for possible dental appliance treatment for sleep apnea  Other options of treatment as discussed  I will see you back in the office in about 3 months   Sleep Apnea Sleep apnea is a condition in which breathing pauses or becomes shallow during sleep. Episodes of sleep apnea usually last 10 seconds or longer, and they may occur as many as 20 times an hour. Sleep apnea disrupts your sleep and keeps your body from getting the rest that it needs. This condition can increase your risk of certain health problems, including:  Heart attack.  Stroke.  Obesity.  Diabetes.  Heart failure.  Irregular heartbeat. There are three kinds of sleep apnea:  Obstructive sleep apnea. This kind is caused by a blocked or collapsed airway.  Central sleep apnea. This kind happens when the part of the brain that controls breathing does not send the correct signals to the muscles that control breathing.  Mixed sleep apnea. This is a combination of obstructive and central sleep apnea. What are the causes? The most common cause of this condition is a collapsed or blocked airway. An airway can collapse or become blocked if:  Your throat muscles are abnormally relaxed.  Your tongue and tonsils are larger than normal.  You are overweight.  Your airway is smaller than normal. What increases the risk? This condition is more likely to develop in people who:  Are overweight.  Smoke.  Have a smaller than normal airway.  Are elderly.  Are female.  Drink alcohol.  Take sedatives or tranquilizers.  Have a family history of sleep apnea. What are the signs or symptoms? Symptoms of this condition include:  Trouble staying asleep.  Daytime sleepiness and tiredness.  Irritability.  Loud snoring.  Morning headaches.  Trouble concentrating.  Forgetfulness.  Decreased interest in  sex.  Unexplained sleepiness.  Mood swings.  Personality changes.  Feelings of depression.  Waking up often during the night to urinate.  Dry mouth.  Sore throat. How is this diagnosed? This condition may be diagnosed with:  A medical history.  A physical exam.  A series of tests that are done while you are sleeping (sleep study). These tests are usually done in a sleep lab, but they may also be done at home. How is this treated? Treatment for this condition aims to restore normal breathing and to ease symptoms during sleep. It may involve managing health issues that can affect breathing, such as high blood pressure or obesity. Treatment may include:  Sleeping on your side.  Using a decongestant if you have nasal congestion.  Avoiding the use of depressants, including alcohol, sedatives, and narcotics.  Losing weight if you are overweight.  Making changes to your diet.  Quitting smoking.  Using a device to open your airway while you sleep, such as: ? An oral appliance. This is a custom-made mouthpiece that shifts your lower jaw forward. ? A continuous positive airway pressure (CPAP) device. This device delivers oxygen to your airway through a mask. ? A nasal expiratory positive airway pressure (EPAP) device. This device has valves that you put into each nostril. ? A bi-level positive airway pressure (BPAP) device. This device delivers oxygen to your airway through a mask.  Surgery if other treatments do not work. During surgery, excess tissue is removed to create a wider airway. It is important to get treatment for sleep  apnea. Without treatment, this condition can lead to:  High blood pressure.  Coronary artery disease.  (Men) An inability to achieve or maintain an erection (impotence).  Reduced thinking abilities. Follow these instructions at home:  Make any lifestyle changes that your health care provider recommends.  Eat a healthy, well-balanced  diet.  Take over-the-counter and prescription medicines only as told by your health care provider.  Avoid using depressants, including alcohol, sedatives, and narcotics.  Take steps to lose weight if you are overweight.  If you were given a device to open your airway while you sleep, use it only as told by your health care provider.  Do not use any tobacco products, such as cigarettes, chewing tobacco, and e-cigarettes. If you need help quitting, ask your health care provider.  Keep all follow-up visits as told by your health care provider. This is important. Contact a health care provider if:  The device that you received to open your airway during sleep is uncomfortable or does not seem to be working.  Your symptoms do not improve.  Your symptoms get worse. Get help right away if:  You develop chest pain.  You develop shortness of breath.  You develop discomfort in your back, arms, or stomach.  You have trouble speaking.  You have weakness on one side of your body.  You have drooping in your face. These symptoms may represent a serious problem that is an emergency. Do not wait to see if the symptoms will go away. Get medical help right away. Call your local emergency services (911 in the [place] Do not drive yourself to the hospital. This information is not intended to replace advice given to you by your health care provider. Make sure you discuss any questions you have with your health care provider. Document Released: 11/14/2002 Document Revised: 06/22/2017 Document Reviewed: 09/03/2015 Elsevier Interactive Patient Education  2019 [employer].

## 2019-01-17 NOTE — Progress Notes (Signed)
Monique Mata    [id number]    1966/02/08  Primary Care Physician:Patient, No Pcp Per  Referring Physician: No referring provider defined for this encounter.  Chief complaint:   Patient with a history of obstructive sleep apnea[place] not started on any treatment  HPI:  Diagnosed with obstructive sleep apnea-does not know the severity She was interested in using an oral device Has a history of snoring, no witnessed apneas Does have a history of headaches, occasional dryness Weight has been fluctuating but generally stable  No family history of obstructive sleep apnea  Feels she gets enough sleep hours at night  History of asthma that is well controlled   Outpatient Encounter Medications as of 01/17/2019  Medication Sig  . albuterol (PROVENTIL HFA;VENTOLIN HFA) 108 (90 Base) MCG/ACT inhaler Inhale 2 puffs into the lungs every 6 (six) hours as needed for shortness of breath.  Marland Kitchen albuterol (PROVENTIL) (2.5 MG/3ML) 0.083% nebulizer solution Take 3 mLs (2.5 mg total) by nebulization every 4 (four) hours as needed for wheezing or shortness of breath.  . budesonide-formoterol (SYMBICORT) 160-4.5 MCG/ACT inhaler Inhale 2 puffs into the lungs every 12 (twelve) hours.  . cetirizine (ZYRTEC) 10 MG tablet Take 10 mg by mouth daily as needed for allergies.   Marland Kitchen ibuprofen (ADVIL,MOTRIN) 800 MG tablet Take 800 mg by mouth every 8 (eight) hours as needed for headache or mild pain.   . [DISCONTINUED] benzonatate (TESSALON) 100 MG capsule Take 100 mg by mouth 3 (three) times daily as needed for cough.  . [DISCONTINUED] doxycycline (VIBRAMYCIN) 100 MG capsule Take 100 mg by mouth 2 (two) times daily. For ten days Started on 12-20-17  . [DISCONTINUED] HYDROcodone-homatropine (HYCODAN) 5-1.5 MG/5ML syrup Take 5 mLs by mouth every 6 (six) hours as needed for cough.  . [DISCONTINUED] predniSONE (DELTASONE) 10 MG tablet Take q day 6,6,5,5,4,4,3,3,2,2,1,1    No facility-administered encounter medications on file as of 01/17/2019.     Allergies as of 01/17/2019 - Review Complete 01/17/2019  Allergen Reaction Noted  . Fish allergy Anaphylaxis 12/22/2018  . Peanut-containing drug products Anaphylaxis 11/29/2015  . Penicillins Anaphylaxis and Other (See Comments) 12/28/2011  . Triptans Other (See Comments) 12/22/2018  . Iodine solution [povidone iodine] Rash 12/28/2011    Past Medical History:  Diagnosis Date  . Bronchitis   . Right ankle swelling   . Seasonal allergies     Past Surgical History:  Procedure Laterality Date  . ABDOMINAL HYSTERECTOMY    . KNEE SURGERY    . LAPAROSCOPIC GASTRIC BANDING      Family History  Problem Relation Age of Onset  . Cancer Mother   . Asthma Father   . Hypertension Father    Review of Systems  Constitutional: Negative.   HENT: Negative.   Eyes: Negative.   Respiratory: Positive for apnea.   Cardiovascular: Negative.   Gastrointestinal: Negative.   Psychiatric/Behavioral: Positive for sleep disturbance.  All other systems reviewed and are negative.   Vitals:   01/17/19 1205  BP: (!) 154/110  Pulse: 80  SpO2: (!) 80%     Physical Exam  Constitutional: She appears well-developed and well-nourished.  HENT:  Head: Normocephalic and atraumatic.  Mallampati 3  Eyes: Pupils are equal, round, and reactive to light. Conjunctivae and EOM are normal. Right eye exhibits no discharge. Left eye exhibits no discharge.  Neck: Normal range of motion. Neck supple. No tracheal deviation present. No  thyromegaly present.  Cardiovascular: Normal rate and regular rhythm.  Pulmonary/Chest: Effort normal and breath sounds normal. No respiratory distress. She has no wheezes. She has no rales.  Abdominal: Soft. Bowel sounds are normal. She exhibits no distension. There is no abdominal tenderness. There is no rebound.   Results of the Epworth flowsheet 01/17/2019  Sitting and reading 0  Watching TV 0    Sitting, inactive in a public place (e.g. a theatre or a meeting) 0  As a passenger in a car for an hour without a break 1  Lying down to rest in the afternoon when circumstances permit 1  Sitting and talking to someone 0  Sitting quietly after a lunch without alcohol 0  In a car, while stopped for a few minutes in traffic 0  Total score 2   Assessment:   Patient with a history of obstructive sleep apnea  Morbid obesity  Plan/Recommendations: Pathophysiology of sleep disordered breathing discussed with the patient  Plan options discussed with the patient  She is interested in an oral device for treatment of sleep disordered breathing  importance of weight loss and exercise discussed   Sherrilyn Rist MD [facility] 01/17/2019, 12:30 PM  CC: No ref. provider found

## 2019-02-09 DIAGNOSIS — G4733 Obstructive sleep apnea (adult) (pediatric): Secondary | ICD-10-CM

## 2019-02-15 ENCOUNTER — Telehealth: Payer: Self-pay | Admitting: Pulmonary Disease

## 2019-02-15 DIAGNOSIS — G4733 Obstructive sleep apnea (adult) (pediatric): Secondary | ICD-10-CM

## 2019-02-15 NOTE — Telephone Encounter (Signed)
Called and spoke with patient regarding results.  Informed the patient of results and recommendations today. Placed order for cpap machine 5-15cm with mask of choice, heated humidifier Pt verbalized understanding and denied any questions or concerns at this time.  Nothing further needed.

## 2019-02-15 NOTE — Telephone Encounter (Signed)
Dr. Ander Slade has reviewed the home sleep test this showed Moderate OSA and Mild Oxygen desaturations AHI 15.3, lowest O2 was 80%.   Recommendations   Treatment options are CPAP with the settings auto 5 to 15cm, mask of choice and supplies.    Weight loss measures encouraged.   Advise against driving while sleepy & against medication with sedative side effects.    Please make appointment for 3 months for compliance with download with Dr. Ander Slade.

## 2019-02-16 ENCOUNTER — Telehealth: Payer: Self-pay | Admitting: Pulmonary Disease

## 2019-02-16 NOTE — Telephone Encounter (Signed)
Spoke with Sonia Baller, she is requesting a sleep study for pt. I advised her that I would fax it to [phone]. Nothing further is needed.

## 2019-04-18 ENCOUNTER — Ambulatory Visit: Payer: Federal, State, Local not specified - PPO | Admitting: Pulmonary Disease

## 2019-04-18 ENCOUNTER — Ambulatory Visit: Payer: Federal, State, Local not specified - PPO | Admitting: Nurse Practitioner

## 2020-02-09 ENCOUNTER — Other Ambulatory Visit: Payer: Self-pay

## 2020-02-09 MED ORDER — ALBUTEROL SULFATE (2.5 MG/3ML) 0.083% IN NEBU: 2.5000 mg | INHALATION_SOLUTION | RESPIRATORY_TRACT | 5 refills | Status: DC | PRN

## 2021-01-06 IMAGING — DX DG CHEST 2V
2 series · 2 of 2 positions shown · non-contrast
Comparison: Radiographs 05/26/2012.

CLINICAL DATA: Cough with shortness of breath for 1 month. Elevated
D-dimer levels. Nonsmoker.

EXAM:
CHEST - 2 VIEW

[chest pa]
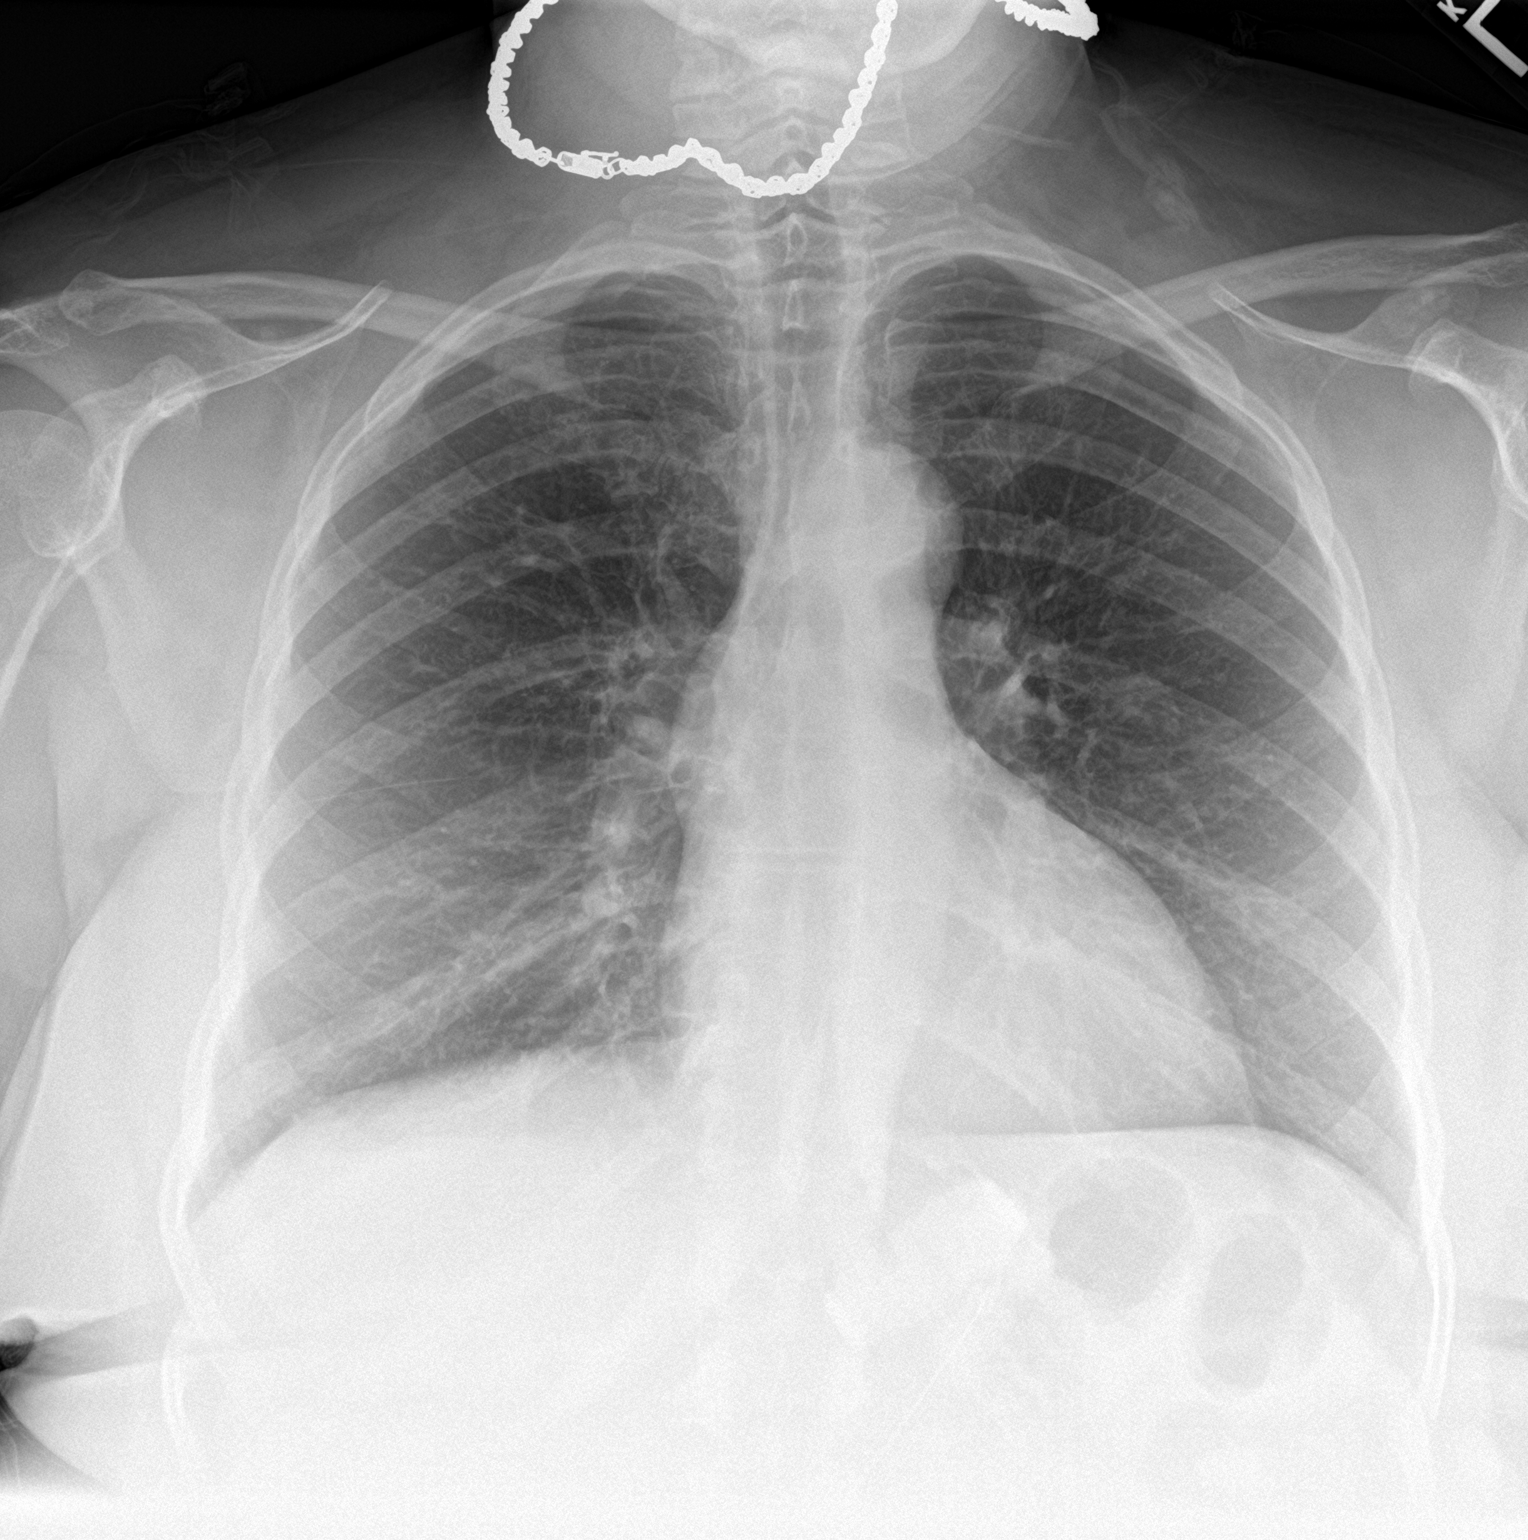

[chest lat]
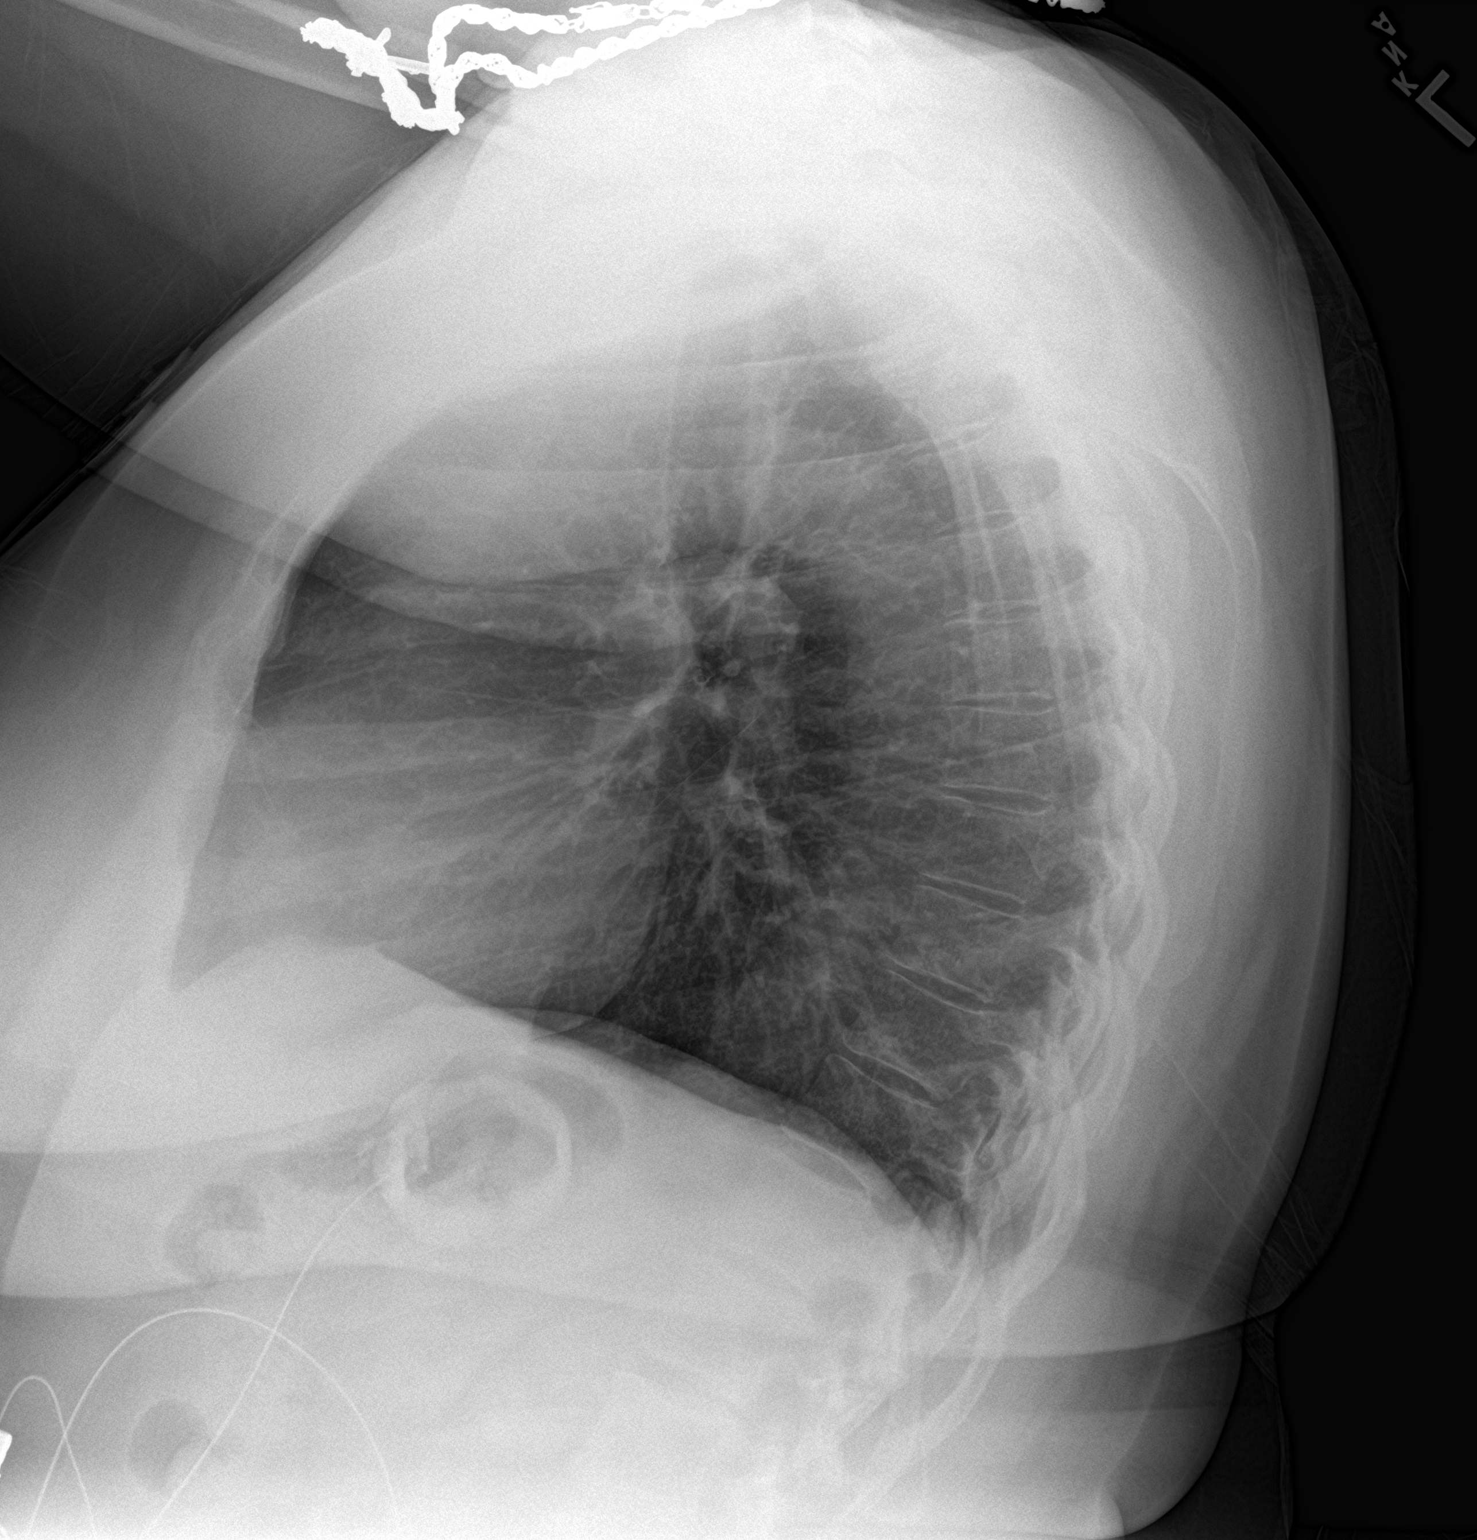

[2 of 2 positions shown; findings below may reference images not displayed]

FINDINGS: The heart size and mediastinal contours are normal. The lungs are
clear. There is no pleural effusion or pneumothorax. No acute
osseous findings are identified. Laparoscopic gastric band noted.
IMPRESSION: Stable chest.  No active cardiopulmonary process.

## 2021-01-06 IMAGING — CT CT ANGIO CHEST
2 of 7 series · 18 of 46 positions shown · IV contrast (APPLIED)
Comparison: Chest radiographs earlier today.

CLINICAL DATA: 52-year-old female with productive cough, shortness
of breath and chest discomfort. Abnormal D-dimer.

EXAM:
CT ANGIOGRAPHY CHEST WITH CONTRAST
TECHNIQUE: Multidetector CT imaging of the chest was performed using the
standard protocol during bolus administration of intravenous
contrast. Multiplanar CT image reconstructions and MIPs were
obtained to evaluate the vascular anatomy.
CONTRAST:  100mL 5JRCVK-7T3 IOPAMIDOL (5JRCVK-7T3) INJECTION 76%

[Series 9: thins · axial · 0.63mm/px · z∈[+1255,+1457]mm · 15 of 327 slices shown]
[im 19/327  lung]
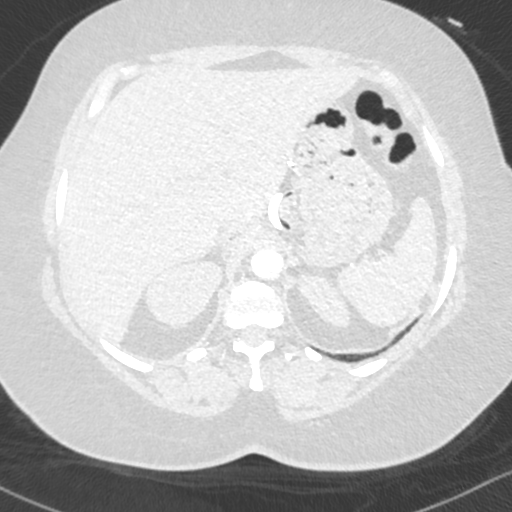
[im 37/327  soft-tissue]
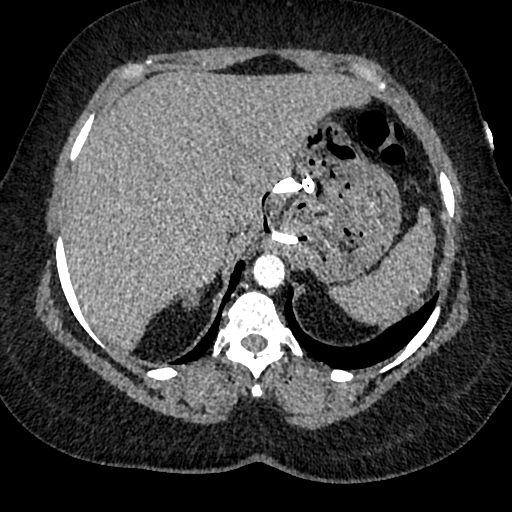
[im 55/327  lung]
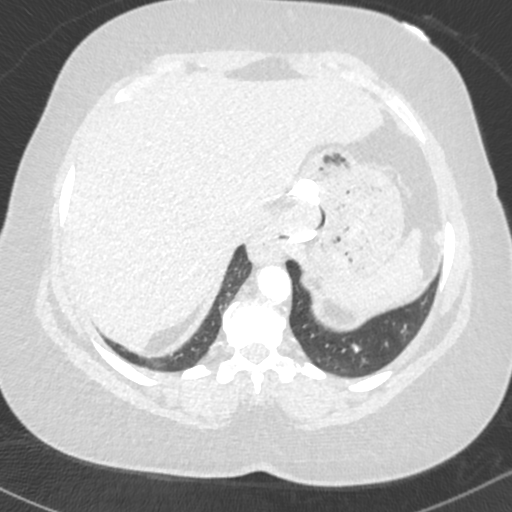
[im 73/327  soft-tissue]
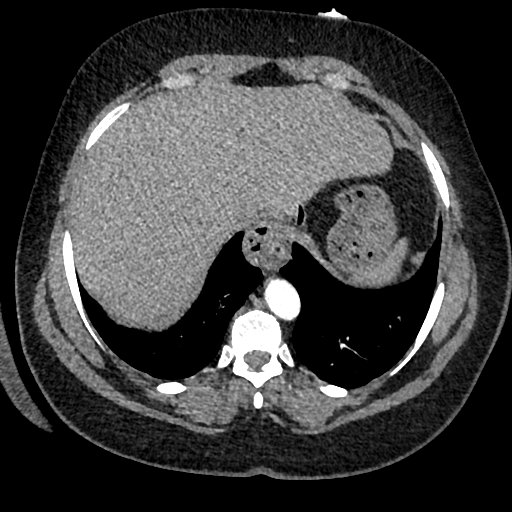
[im 109/327  lung]
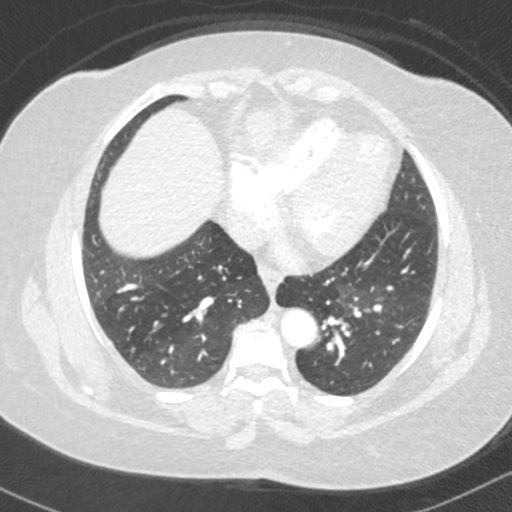
[im 127/327  soft-tissue]
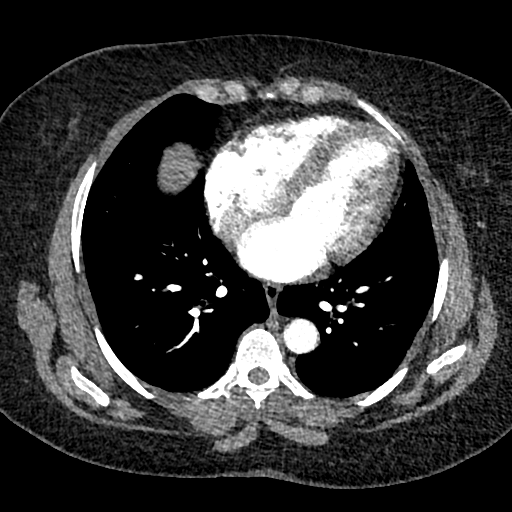
[im 145/327  lung]
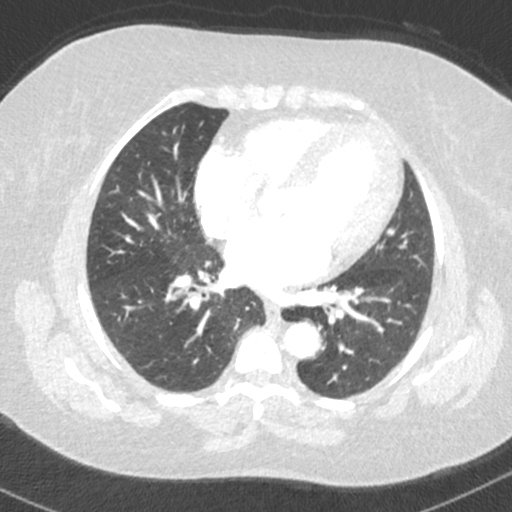
[im 164/327  soft-tissue]
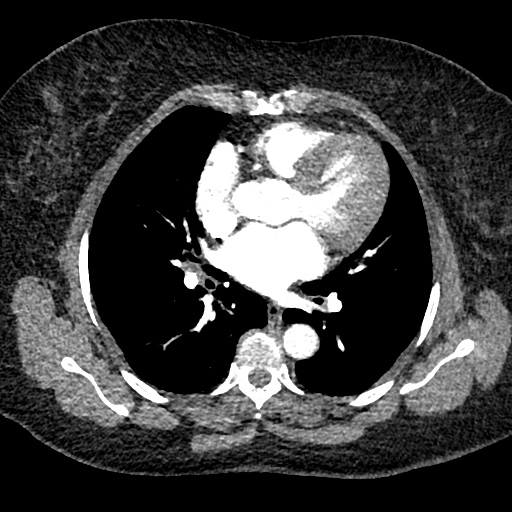
[im 182/327  lung]
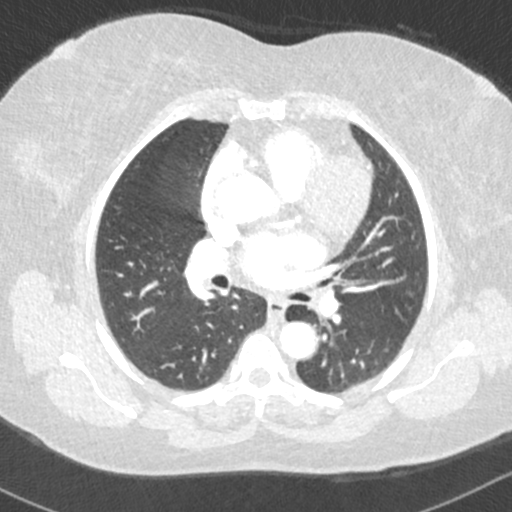
[im 200/327  soft-tissue]
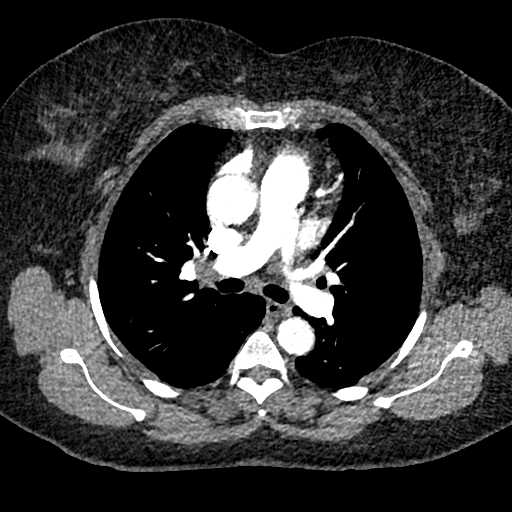
[im 218/327  lung]
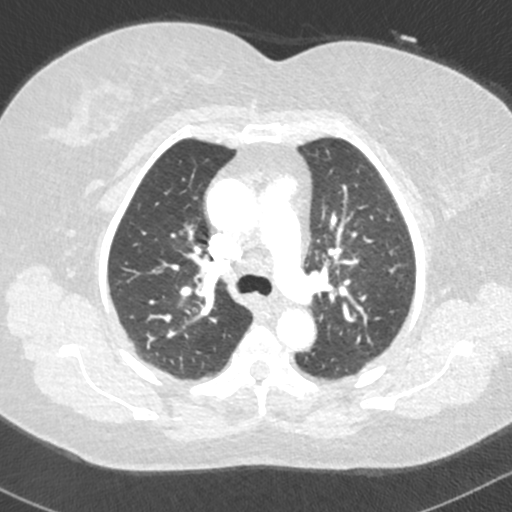
[im 254/327  soft-tissue]
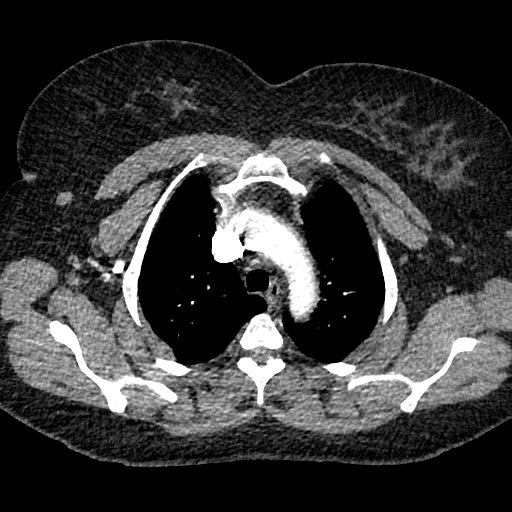
[im 272/327  lung]
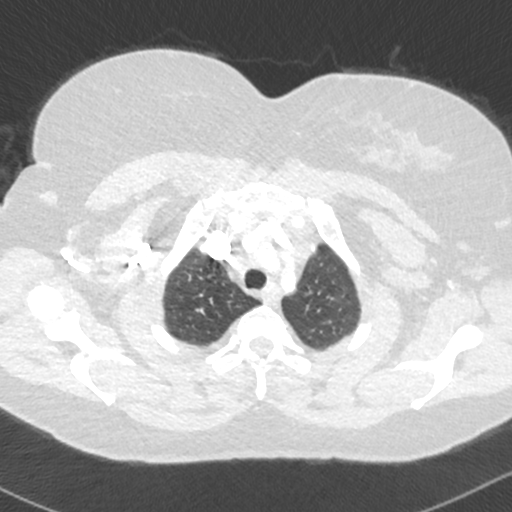
[im 290/327  soft-tissue]
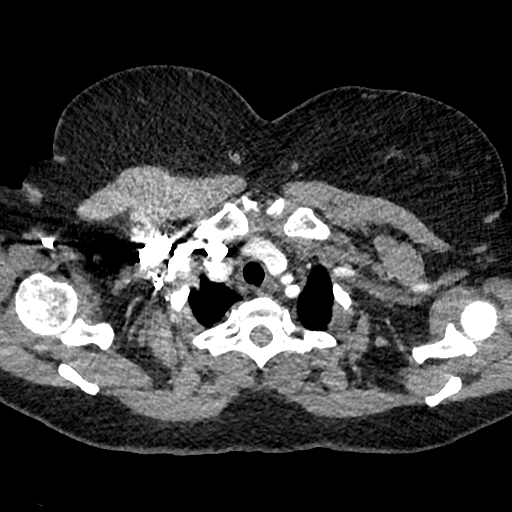
[im 308/327  lung]
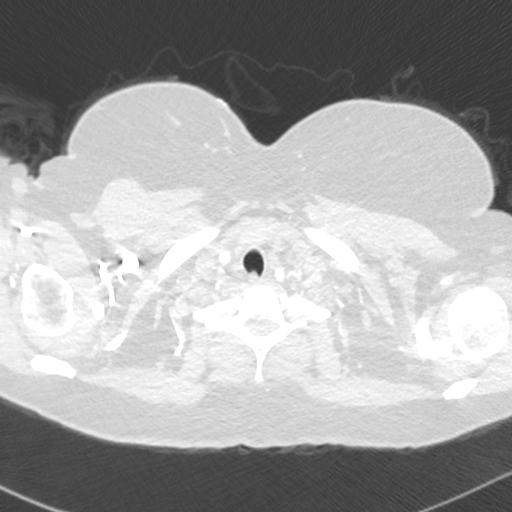

[Series 10: cor · coronal · 0.44mm/px · 3 of 151 slices shown]
[im 38/151  soft-tissue]
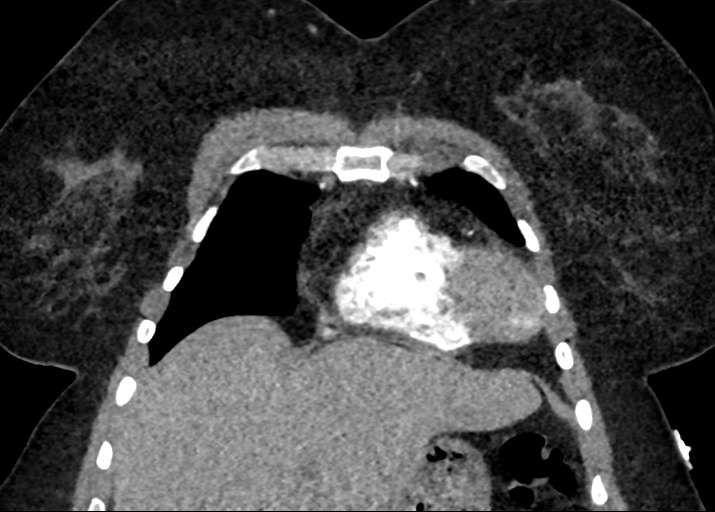
[im 76/151  soft-tissue]
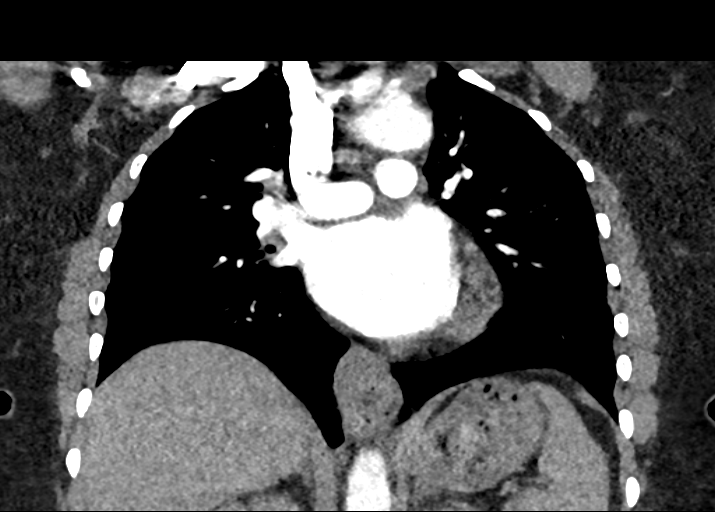
[im 113/151  soft-tissue]
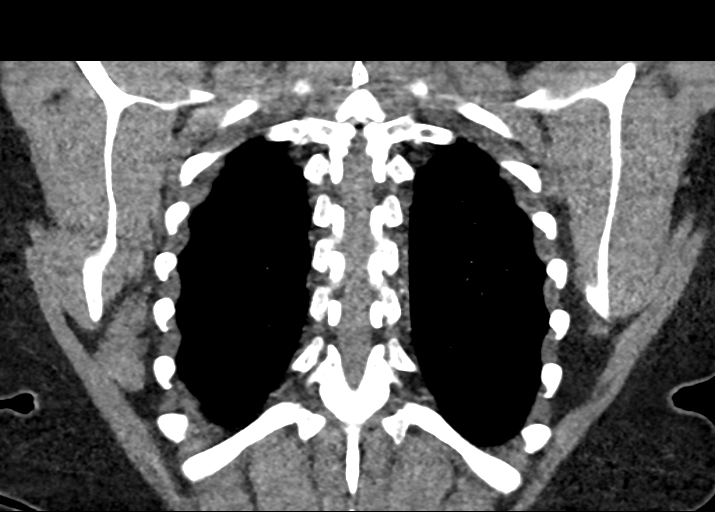

[18 of 46 positions shown; findings below may reference images not displayed]

FINDINGS: Cardiovascular: Good contrast bolus timing in the pulmonary arterial
tree.

No focal filling defect identified in the pulmonary arteries to
suggest acute pulmonary embolism.

Cardiac size at the upper limits of normal to mildly enlarged. No
pericardial effusion. Negative visible aorta.

Mediastinum/Nodes: Negative. No lymphadenopathy.

Lungs/Pleura: Major airways are patent. Questionable bilateral lower
lobe peribronchial thickening (series 6, image 64). Mild mosaic
attenuation in the lower lobe such as due to gas trapping. No
pleural effusion. No abnormal pulmonary opacity.

Upper Abdomen: Partially visible gastric band. Negative visible
liver, spleen, adrenal glands, kidneys, splenic flexure.

Musculoskeletal: No acute osseous abnormality identified.

Review of the MIP images confirms the above findings.
IMPRESSION: 1. Negative for acute pulmonary embolus.
2. Suspect bilateral lower lobe peribronchial thickening and gas
trapping. Consider bronchitis/airway disease. No pleural effusion or
pneumonia.

## 2021-06-04 ENCOUNTER — Telehealth: Payer: Self-pay

## 2021-06-04 NOTE — Telephone Encounter (Signed)
Linda:  Per Dr. Tarri Glenn request, please schedule pt at [facility] for direct colon.  Thank you

## 2021-06-04 NOTE — Telephone Encounter (Signed)
Dr. Tarri Glenn.  Pt's BMI is 50.86.  I verified her wt and ht and discussed concern about having procedure done here or at [facility].  She felt she could not loose the 5 lbs to make her appropriate for [facility].  Would you like to send her direct to [facility] or have an office visit?  Thank you so much.

## 2021-06-07 NOTE — Telephone Encounter (Signed)
Offered pt the 07/18/21 hospital date, she states this will not work.

## 2021-06-07 NOTE — Telephone Encounter (Signed)
Ok, sorry Vaughan Basta, thank you!

## 2021-06-11 NOTE — Telephone Encounter (Signed)
Vaughan Basta working on finding a hospital date

## 2021-06-25 ENCOUNTER — Encounter: Payer: Federal, State, Local not specified - PPO | Admitting: Gastroenterology

## 2021-07-16 ENCOUNTER — Other Ambulatory Visit: Payer: Self-pay

## 2021-07-16 DIAGNOSIS — Z1211 Encounter for screening for malignant neoplasm of colon: Secondary | ICD-10-CM

## 2021-07-16 NOTE — Telephone Encounter (Signed)
Pt scheduled for colon at [facility] 08/06/21'@11'$ :15am..

## 2021-07-23 NOTE — Telephone Encounter (Signed)
Pt informed of Appointment scheduled for colonoscopy at [facility] on 08/06/21 at 11:15. Pt stated that she would call us back to insure that this fits her schedule. Will await for callback.

## 2021-07-31 ENCOUNTER — Other Ambulatory Visit: Payer: Self-pay

## 2021-07-31 ENCOUNTER — Encounter (HOSPITAL_COMMUNITY): Payer: Self-pay | Admitting: Gastroenterology

## 2021-07-31 NOTE — Telephone Encounter (Signed)
Pt stated that the Colonoscopy appointment was good for her. Previsit appointment scheduled for 08/02/21 at 4:30. Pt request telephone previsit.

## 2021-08-02 ENCOUNTER — Other Ambulatory Visit: Payer: Self-pay

## 2021-08-02 ENCOUNTER — Ambulatory Visit (AMBULATORY_SURGERY_CENTER): Payer: Federal, State, Local not specified - PPO

## 2021-08-02 VITALS — Ht 60.0 in | Wt 255.0 lb

## 2021-08-02 DIAGNOSIS — Z1211 Encounter for screening for malignant neoplasm of colon: Secondary | ICD-10-CM

## 2021-08-02 NOTE — Progress Notes (Signed)

## 2021-08-06 ENCOUNTER — Encounter (HOSPITAL_COMMUNITY): Payer: Self-pay | Admitting: Gastroenterology

## 2021-08-06 ENCOUNTER — Encounter (HOSPITAL_COMMUNITY)
Admission: RE | Disposition: A | Discharge status: Home / Self Care | Payer: Self-pay | Source: Home / Self Care | Attending: Gastroenterology

## 2021-08-06 ENCOUNTER — Ambulatory Visit (HOSPITAL_COMMUNITY): Payer: Federal, State, Local not specified - PPO | Admitting: Anesthesiology

## 2021-08-06 ENCOUNTER — Other Ambulatory Visit: Payer: Self-pay

## 2021-08-06 ENCOUNTER — Ambulatory Visit (HOSPITAL_COMMUNITY)
Admission: RE | Admit: 2021-08-06 | Discharge: 2021-08-06 | Disposition: A | Discharge status: Home / Self Care | Payer: Federal, State, Local not specified - PPO | Attending: Gastroenterology | Admitting: Gastroenterology

## 2021-08-06 DIAGNOSIS — Z6841 Body Mass Index (BMI) 40.0 and over, adult: Secondary | ICD-10-CM | POA: Diagnosis not present

## 2021-08-06 DIAGNOSIS — Z8249 Family history of ischemic heart disease and other diseases of the circulatory system: Secondary | ICD-10-CM | POA: Insufficient documentation

## 2021-08-06 DIAGNOSIS — Z9884 Bariatric surgery status: Secondary | ICD-10-CM | POA: Insufficient documentation

## 2021-08-06 DIAGNOSIS — D122 Benign neoplasm of ascending colon: Secondary | ICD-10-CM | POA: Diagnosis not present

## 2021-08-06 DIAGNOSIS — Z880 Allergy status to penicillin: Secondary | ICD-10-CM | POA: Diagnosis not present

## 2021-08-06 DIAGNOSIS — Z888 Allergy status to other drugs, medicaments and biological substances status: Secondary | ICD-10-CM | POA: Insufficient documentation

## 2021-08-06 DIAGNOSIS — Z1211 Encounter for screening for malignant neoplasm of colon: Secondary | ICD-10-CM | POA: Insufficient documentation

## 2021-08-06 DIAGNOSIS — D120 Benign neoplasm of cecum: Secondary | ICD-10-CM | POA: Insufficient documentation

## 2021-08-06 DIAGNOSIS — E785 Hyperlipidemia, unspecified: Secondary | ICD-10-CM | POA: Diagnosis not present

## 2021-08-06 DIAGNOSIS — Z90710 Acquired absence of both cervix and uterus: Secondary | ICD-10-CM | POA: Insufficient documentation

## 2021-08-06 DIAGNOSIS — I10 Essential (primary) hypertension: Secondary | ICD-10-CM | POA: Insufficient documentation

## 2021-08-06 HISTORY — DX: Essential (primary) hypertension: I10

## 2021-08-06 HISTORY — PX: POLYPECTOMY: SHX5525

## 2021-08-06 HISTORY — PX: COLONOSCOPY WITH PROPOFOL: SHX5780

## 2021-08-06 SURGERY — COLONOSCOPY WITH PROPOFOL
Anesthesia: Monitor Anesthesia Care

## 2021-08-06 MED ORDER — PROPOFOL 500 MG/50ML IV EMUL
INTRAVENOUS | Status: AC
  Filled 2021-08-06: qty 50

## 2021-08-06 MED ORDER — LACTATED RINGERS IV SOLN: INTRAVENOUS | Status: DC | PRN

## 2021-08-06 MED ORDER — PROPOFOL 10 MG/ML IV BOLUS
INTRAVENOUS | Status: DC | PRN
  Administered 2021-08-06: 40 mg via INTRAVENOUS

## 2021-08-06 MED ORDER — LACTATED RINGERS IV SOLN: Freq: Once | INTRAVENOUS | Status: AC

## 2021-08-06 MED ORDER — PROPOFOL 500 MG/50ML IV EMUL
INTRAVENOUS | Status: DC | PRN
  Administered 2021-08-06: 150 ug/kg/min via INTRAVENOUS

## 2021-08-06 MED ORDER — SODIUM CHLORIDE 0.9 % IV SOLN: INTRAVENOUS | Status: DC

## 2021-08-06 SURGICAL SUPPLY — 21 items

## 2021-08-06 NOTE — Anesthesia Postprocedure Evaluation (Signed)
Anesthesia Post Note  Patient: Monique Mata  Procedure(s) Performed: COLONOSCOPY WITH PROPOFOL POLYPECTOMY     Patient location during evaluation: Endoscopy Anesthesia Type: MAC Level of consciousness: awake Pain management: pain level controlled Vital Signs Assessment: post-procedure vital signs reviewed and stable Respiratory status: spontaneous breathing, nonlabored ventilation, respiratory function stable and patient connected to nasal cannula oxygen Cardiovascular status: stable and blood pressure returned to baseline Postop Assessment: no apparent nausea or vomiting Anesthetic complications: no   No notable events documented.  Last Vitals:  Vitals:   08/06/21 1120 08/06/21 1130  BP: 137/76 (!) 144/80  Pulse: 71 68  Resp: 13 14  Temp:    SpO2: 100% 100%    Last Pain:  Vitals:   08/06/21 1130  TempSrc:   PainSc: 0-No pain                 Dakia Schifano P Madox Corkins

## 2021-08-06 NOTE — Op Note (Signed)
[facility] Patient Name: Monique Mata Procedure Date: 08/06/2021 MRN: [id number] Attending MD: Thornton Park MD, MD Date of Birth: 1966/02/01 CSN: [id number] Age: 55 Admit Type: Outpatient Procedure:                Colonoscopy Indications:              Screening for colorectal malignant neoplasm, This                            is the patient's first colonoscopy Providers:                Thornton Park MD, MD, Mariana Arn, [place], Technician, [address]. Zenia Resides CRNA, CRNA Referring MD:              Medicines:                Monitored Anesthesia Care Complications:            No immediate complications. Estimated blood loss:                            Minimal. Estimated Blood Loss:     Estimated blood loss was minimal. Procedure:                Pre-Anesthesia Assessment:                           - Prior to the procedure, a History and Physical                            was performed, and patient medications and                            allergies were reviewed. The patient's tolerance of                            previous anesthesia was also reviewed. The risks                            and benefits of the procedure and the sedation                            options and risks were discussed with the patient.                            All questions were answered, and informed consent                            was obtained. Prior Anticoagulants: The patient has                            taken no previous anticoagulant or antiplatelet  agents. ASA Grade Assessment: III - A patient with                            severe systemic disease. After reviewing the risks                            and benefits, the patient was deemed in                            satisfactory condition to undergo the procedure.                           After obtaining informed consent, the colonoscope                             was passed under direct vision. Throughout the                            procedure, the patient's blood pressure, pulse, and                            oxygen saturations were monitored continuously. The                            [id number]) Olympus colonoscope was                            introduced through the anus and advanced to the 3                            cm into the ileum. A second forward view of the                            right colon was performed. The colonoscopy was                            performed without difficulty. The patient tolerated                            the procedure well. The quality of the bowel                            preparation was good. The terminal ileum, ileocecal                            valve, appendiceal orifice, and rectum were                            photographed. Scope In: 10:46:38 AM Scope Out: 10:58:01 AM Scope Withdrawal Time: 0 hours 6 minutes 39 seconds  Total Procedure Duration: 0 hours 11 minutes 23 seconds  Findings:      The perianal and digital rectal examinations were normal.      A 2 mm polyp was found in  the cecum. The polyp was flat. The polyp was       removed with a cold snare. Resection and retrieval were complete.       Estimated blood loss was minimal.      The exam was otherwise without abnormality on direct and retroflexion       views. Impression:               - One 2 mm polyp in the cecum, removed with a cold                            snare. Resected and retrieved.                           - The examination was otherwise normal on direct                            and retroflexion views. Moderate Sedation:      Not Applicable - Patient had care per Anesthesia. Recommendation:           - Patient has a contact number available for                            emergencies. The signs and symptoms of potential                            delayed complications were discussed with the                             patient. Return to normal activities tomorrow.                            Written discharge instructions were provided to the                            patient.                           - Resume previous diet.                           - Continue present medications.                           - Await pathology results.                           - Repeat colonoscopy date to be determined after                            pending pathology results are reviewed for                            surveillance.                           - Emerging evidence supports eating a diet of  fruits, vegetables, grains, calcium, and yogurt                            while reducing red meat and alcohol may reduce the                            risk of colon cancer.                           - Thank you for allowing me to be involved in your                            colon cancer prevention. Procedure Code(s):        --- Professional ---                           [phone], Colonoscopy, flexible; with removal of                            tumor(s), polyp(s), or other lesion(s) by snare                            technique Diagnosis Code(s):        --- Professional ---                           Z12.11, Encounter for screening for malignant                            neoplasm of colon                           K63.5, Polyp of colon CPT copyright 2019 American [employer] Association. All rights reserved. The codes documented in this report are preliminary and upon coder review may  be revised to meet current compliance requirements. Thornton Park MD, MD 08/06/2021 11:04:16 AM This report has been signed electronically. Number of Addenda: 0

## 2021-08-06 NOTE — Anesthesia Procedure Notes (Signed)
Procedure Name: MAC Date/Time: 08/06/2021 10:43 AM Performed by: Lollie Sails, CRNA Pre-anesthesia Checklist: Patient identified, Emergency Drugs available, Suction available, Patient being monitored and Timeout performed Oxygen Delivery Method: Simple face mask Placement Confirmation: positive ETCO2

## 2021-08-06 NOTE — Transfer of Care (Signed)
Immediate Anesthesia Transfer of Care Note  Patient: Monique Mata  Procedure(s) Performed: COLONOSCOPY WITH PROPOFOL POLYPECTOMY  Patient Location: PACU and Endoscopy Unit  Anesthesia Type:MAC  Level of Consciousness: awake, alert , oriented and patient cooperative  Airway & Oxygen Therapy: Patient Spontanous Breathing and Patient connected to face mask oxygen  Post-op Assessment: Report given to RN and Post -op Vital signs reviewed and stable  Post vital signs: Reviewed and stable  Last Vitals:  Vitals Value Taken Time  BP 124/79 08/06/21 1104  Temp    Pulse 82 08/06/21 1105  Resp 19 08/06/21 1105  SpO2 100 % 08/06/21 1105  Vitals shown include unvalidated device data.  Last Pain:  Vitals:   08/06/21 0938  TempSrc: Oral  PainSc: 0-No pain         Complications: No notable events documented.

## 2021-08-06 NOTE — H&P (Signed)
   Referring Provider: No ref. provider found Primary Care Physician:  [place] for Consultation:  Colon cancer screening   IMPRESSION:  Need for colon cancer screening BMI 50   PLAN: Colonoscopy    HPI: Monique Mata is a 55 y.o. female presents for screening colonoscopy.  No prior colonoscopy or colon cancer screening.  No baseline GI symptoms.   No known family history of colon cancer or polyps. No family history of uterine/endometrial cancer, pancreatic cancer or gastric/stomach cancer.   Past Medical History:  Diagnosis Date   Asthma    more likely to flair seasonally   Bronchitis    Hyperlipidemia    Hypertension    Right ankle swelling    Seasonal allergies     Past Surgical History:  Procedure Laterality Date   ABDOMINAL HYSTERECTOMY     FOOT SURGERY     KNEE SURGERY     LAPAROSCOPIC GASTRIC BANDING      Current Facility-Administered Medications  Medication Dose Route Frequency Provider Last Rate Last Admin   0.9 %  sodium chloride infusion   Intravenous Continuous [place], MD        Allergies as of 07/16/2021 - Review Complete 01/17/2019  Allergen Reaction Noted   Fish allergy Anaphylaxis 12/22/2018   Peanut-containing drug products Anaphylaxis 11/29/2015   Penicillins Anaphylaxis and Other (See Comments) 12/28/2011   Triptans Other (See Comments) 12/22/2018   Iodine solution [povidone iodine] Rash 12/28/2011    Family History  Problem Relation Age of Onset   Cancer Mother    Asthma Father    Hypertension Father    Esophageal cancer Paternal Grandfather 47   Colon polyps Neg Hx    Colon cancer Neg Hx    Rectal cancer Neg Hx    Stomach cancer Neg Hx      Physical Exam: General:   Alert,  well-nourished, pleasant and cooperative in NAD Head:  Normocephalic and atraumatic. Eyes:  Sclera clear, no icterus.   Conjunctiva pink. Mouth:  No deformity or lesions.   Neck:  Supple; no masses or  thyromegaly. Lungs:  Clear throughout to auscultation.   No wheezes. Heart:  Regular rate and rhythm; no murmurs. Abdomen:  Soft, non-tender, nondistended, normal bowel sounds, no rebound or guarding.  Msk:  Symmetrical. No boney deformities LAD: No inguinal or umbilical LAD Extremities:  No clubbing or edema. Neurologic:  Alert and  oriented x4;  grossly nonfocal Skin:  No obvious rash or bruise. Psych:  Alert and cooperative. Normal mood and affect.     Studies/Results: No results found.    Jayana Kotula L. Tarri Glenn, MD, MPH 08/06/2021, 9:43 AM

## 2021-08-06 NOTE — Discharge Instructions (Signed)

## 2021-08-06 NOTE — Anesthesia Preprocedure Evaluation (Signed)
Anesthesia Evaluation  Patient identified by MRN, date of birth, ID band Patient awake    Reviewed: Allergy & Precautions, NPO status , Patient's Chart, lab work & pertinent test results  Airway Mallampati: I  TM Distance: >3 FB Neck ROM: Full    Dental no notable dental hx.    Pulmonary asthma ,    Pulmonary exam normal breath sounds clear to auscultation       Cardiovascular hypertension, Pt. on medications Normal cardiovascular exam Rhythm:Regular Rate:Normal     Neuro/Psych negative neurological ROS  negative psych ROS   GI/Hepatic negative GI ROS, Neg liver ROS, Bowel prep,  Endo/Other  Morbid obesity (SUPER)  Renal/GU negative Renal ROS     Musculoskeletal negative musculoskeletal ROS (+)   Abdominal (+) + obese,   Peds  Hematology HLD   Anesthesia Other Findings Screening for colon cancer  Reproductive/Obstetrics                             Anesthesia Physical Anesthesia Plan  ASA: 4  Anesthesia Plan: MAC   Post-op Pain Management:    Induction: Intravenous  PONV Risk Score and Plan: 2 and Propofol infusion and Treatment may vary due to age or medical condition  Airway Management Planned: Simple Face Mask  Additional Equipment:   Intra-op Plan:   Post-operative Plan:   Informed Consent: I have reviewed the patients History and Physical, chart, labs and discussed the procedure including the risks, benefits and alternatives for the proposed anesthesia with the patient or authorized representative who has indicated his/her understanding and acceptance.     Dental advisory given  Plan Discussed with: CRNA  Anesthesia Plan Comments:         Anesthesia Quick Evaluation

## 2021-08-07 ENCOUNTER — Encounter: Payer: Self-pay | Admitting: Gastroenterology

## 2021-08-07 LAB — SURGICAL PATHOLOGY

## 2021-08-08 ENCOUNTER — Encounter (HOSPITAL_COMMUNITY): Payer: Self-pay | Admitting: Gastroenterology

## 2023-05-25 ENCOUNTER — Emergency Department (HOSPITAL_BASED_OUTPATIENT_CLINIC_OR_DEPARTMENT_OTHER)
Admission: EM | Admit: 2023-05-25 | Discharge: 2023-05-25 | Disposition: A | Discharge status: Home / Self Care | Payer: Federal, State, Local not specified - PPO | Attending: Emergency Medicine | Admitting: Emergency Medicine

## 2023-05-25 ENCOUNTER — Emergency Department (HOSPITAL_BASED_OUTPATIENT_CLINIC_OR_DEPARTMENT_OTHER): Payer: Federal, State, Local not specified - PPO

## 2023-05-25 ENCOUNTER — Other Ambulatory Visit: Payer: Self-pay

## 2023-05-25 DIAGNOSIS — Z79899 Other long term (current) drug therapy: Secondary | ICD-10-CM | POA: Insufficient documentation

## 2023-05-25 DIAGNOSIS — R531 Weakness: Secondary | ICD-10-CM | POA: Diagnosis not present

## 2023-05-25 DIAGNOSIS — I10 Essential (primary) hypertension: Secondary | ICD-10-CM | POA: Diagnosis not present

## 2023-05-25 DIAGNOSIS — J45909 Unspecified asthma, uncomplicated: Secondary | ICD-10-CM | POA: Diagnosis not present

## 2023-05-25 DIAGNOSIS — Z91010 Allergy to peanuts: Secondary | ICD-10-CM | POA: Diagnosis not present

## 2023-05-25 DIAGNOSIS — B0223 Postherpetic polyneuropathy: Secondary | ICD-10-CM | POA: Insufficient documentation

## 2023-05-25 DIAGNOSIS — R21 Rash and other nonspecific skin eruption: Secondary | ICD-10-CM | POA: Diagnosis present

## 2023-05-25 NOTE — ED Notes (Signed)
EDP consulted- no orders at this time.

## 2023-05-25 NOTE — ED Notes (Signed)
Pt care taken no complaints at this time. 

## 2023-05-25 NOTE — ED Triage Notes (Addendum)
Arrives POV to ER. Ambulatory to triage with cane.  Being treated for shingles flare up and sciatic nerve pain. Scabbed over sores on right leg.  Comes in to the ER for pins and needle feelings both arms,and back-starting yesterday. Right foot numbness-2 weeks. Has been having frequent falls- 2x Wednesday, x1 Tuesday. Unsure if falls due to leg giving out or from foot numbness.

## 2023-05-25 NOTE — Discharge Instructions (Signed)
Suspect all symptoms may be secondary to the shingles infection.  It is possible with a polyneuropathy that she could also have what is occurring up in the arms and chest area.  It is also possible that there could be a component of sciatica related to the left leg but I think it secondary to the shingles.  Continue your gabapentin keep your appointment with your primary care doctor for Thursday.  Work note provided to be out of work until Friday.  Return for any new or worse symptoms.  X-ray of the right knee showed no bony abnormalities or inflammation.

## 2023-05-25 NOTE — ED Provider Notes (Signed)
[place] EMERGENCY DEPARTMENT AT [facility] Provider Note   CSN: [id number] Arrival date & time: 05/25/23  1629     History  Chief Complaint  Patient presents with   Marletta Lor    YANIYA GABAY is a 57 y.o. female.  Patient had been seen by orthopedics for what was thought to maybe be left-sided sciatica.  They are planning on seeing her back.  However patient has developed a shingles type rash to the lateral aspect of the right leg kind of at the knee and just down below.  That rash is now pretty much scabbed over.  That started about 2 weeks ago.  Patient was treated with valacyclovir.  Patient is on gabapentin.  Patient does have follow-up with her primary care doctor scheduled for Thursday.  Patient has got numbness on top of her foot and bottom of her foot has pain that radiates down the right leg.  And then today kind of has a hypersensitivity to both upper extremities without really a rash across her anterior chest and along the back area.  But no weakness.  No headache.  No visual changes no speech abnormalities.  Patient has not had the shingles vaccines.  Patient because of the numbness to the right leg and the pain this seems to be some weakness secondary to the pain patient has used a cane of late to walk which is better but she did have a fall recently and is worried about injury to her right knee.  She did not hurt anything else with the fall.  No fever as well.  Past medical history significant for hypertension hyperlipidemia and seasonal asthma.  Past surgical history significant for abdominal hysterectomy knee surgery foot surgery and laparoscopic gastric banding.  Patient is never used tobacco products.       Home Medications Prior to Admission medications   Medication Sig Start Date End Date Taking? Authorizing Provider  albuterol (PROVENTIL HFA;VENTOLIN HFA) 108 (90 Base) MCG/ACT inhaler Inhale 2 puffs into the lungs every 6 (six) hours as needed for  shortness of breath. Patient not taking: No sig reported 12/13/18   [provider]  albuterol (PROVENTIL) (2.5 MG/3ML) 0.083% nebulizer solution Take 3 mLs (2.5 mg total) by nebulization every 4 (four) hours as needed for wheezing or shortness of breath. Patient not taking: No sig reported 02/09/20   Icard, Rachel Bo, DO  alclomethasone (ACLOVATE) 0.05 % ointment Apply topically. 07/29/21   [provider]  atorvastatin (LIPITOR) 20 MG tablet Take 20 mg by mouth daily. 04/29/21   [provider]  budesonide-formoterol (SYMBICORT) 160-4.5 MCG/ACT inhaler Inhale 2 puffs into the lungs every 12 (twelve) hours. Patient not taking: No sig reported 12/24/18   Icard, Bradley L, DO  cetirizine (ZYRTEC) 10 MG tablet Take 10 mg by mouth daily as needed for allergies.     [provider]  clobetasol ointment (TEMOVATE) 0.05 % Apply 1 application topically 2 (two) times daily.    [provider]  desonide (DESOWEN) 0.05 % ointment Apply 1 application topically 2 (two) times daily.    [provider]  ergocalciferol (VITAMIN D2) 1.25 MG (50000 UT) capsule Take 50,000 Units by mouth every Monday.    [provider]  hydrochlorothiazide (HYDRODIURIL) 12.5 MG tablet Take 12.5 mg by mouth every morning. 04/29/21   [provider]  ibuprofen (ADVIL,MOTRIN) 800 MG tablet Take 800 mg by mouth every 8 (eight) hours as needed for headache or mild pain. 08/12/18  [provider]  lisinopril (ZESTRIL) 20 MG tablet Take 20 mg by mouth daily. 05/27/21   [provider]      Allergies    Fish allergy, Peanut-containing drug products, Penicillins, Triptans, and Iodine solution [povidone iodine]    Review of Systems   Review of Systems  Constitutional:  Negative for chills and fever.  HENT:  Negative for ear pain and sore throat.   Eyes:  Negative for pain and visual disturbance.  Respiratory:  Negative for cough and shortness of breath.    Cardiovascular:  Negative for chest pain and palpitations.  Gastrointestinal:  Negative for abdominal pain and vomiting.  Genitourinary:  Negative for dysuria and hematuria.  Musculoskeletal:  Negative for arthralgias and back pain.  Skin:  Positive for rash. Negative for color change.  Neurological:  Positive for weakness and numbness. Negative for dizziness, seizures, syncope, facial asymmetry, speech difficulty, light-headedness and headaches.  All other systems reviewed and are negative.   Physical Exam Updated Vital Signs BP (!) 148/86   Pulse 85   Temp 98.1 F (36.7 C)   Resp 18   Ht 1.524 m (5')   Wt 117.9 kg   SpO2 100%   BMI 50.78 kg/m  Physical Exam Vitals and nursing note reviewed.  Constitutional:      General: She is not in acute distress.    Appearance: Normal appearance. She is well-developed.  HENT:     Head: Normocephalic and atraumatic.     Mouth/Throat:     Mouth: Mucous membranes are moist.  Eyes:     Extraocular Movements: Extraocular movements intact.     Conjunctiva/sclera: Conjunctivae normal.     Pupils: Pupils are equal, round, and reactive to light.  Cardiovascular:     Rate and Rhythm: Normal rate and regular rhythm.     Heart sounds: No murmur heard. Pulmonary:     Effort: Pulmonary effort is normal. No respiratory distress.     Breath sounds: Normal breath sounds.  Abdominal:     Palpations: Abdomen is soft.     Tenderness: There is no abdominal tenderness.  Musculoskeletal:        General: No swelling.     Cervical back: Normal range of motion and neck supple.     Comments: Weakness to plantarflexion dorsiflexion of the right toe.  Toward dorsalis pedis pulses 1+.  Good cap refill to both feet.  Decreased sensation to the top of the right foot and the bottom of the right foot.  Left foot normal.  No obvious swelling to the right knee.  But questionable deformity.  Pain with elevation of the right leg and movement at the knee.  Left lower  extremity with good strength upper extremities with good strength.  Skin:    General: Skin is warm and dry.     Capillary Refill: Capillary refill takes less than 2 seconds.     Findings: Erythema and rash present.     Comments: Healing rash that is almost completely scabbed over on the lateral aspect of the right leg.  From the knee down to about mid leg area.  Neurological:     General: No focal deficit present.     Mental Status: She is alert and oriented to person, place, and time.     Cranial Nerves: No cranial nerve deficit.     Sensory: Sensory deficit present.     Motor: Weakness present.  Psychiatric:        Mood and Affect:  Mood normal.     ED Results / Procedures / Treatments   Labs (all labs ordered are listed, but only abnormal results are displayed) Labs Reviewed - No data to display  EKG None  Radiology DG Knee Complete 4 Views Right  Result Date: 05/25/2023 CLINICAL DATA:  Pain, fall EXAM: RIGHT KNEE - COMPLETE 4+ VIEW COMPARISON:  None Available. FINDINGS: No fracture or malalignment. No sizable knee effusion. There are mild degenerative changes IMPRESSION: No acute osseous abnormality. Electronically Signed   By: Jasmine Pang M.D.   On: 05/25/2023 19:14    Procedures Procedures    Medications Ordered in ED Medications - No data to display  ED Course/ Medical Decision Making/ A&P                             Medical Decision Making Amount and/or Complexity of Data Reviewed Radiology: ordered.   Suspect all of patient's symptoms may very well be related to the herpes zoster or the shingles.  It is also possible there could be a component of left sciatica as well but it could all be related to the shingles because a lot of this is nerve neuropathy with neuropathic kind of pain.  It is possible that the weakness to dorsiflexion plantarflexion of the right great toe and the numbness on top of the foot and bottom of the foot could be secondary to sciatica as  well.  Left lower extremity completely normal.  Unless there is a polyneuropathy not sure how we could explain some of the discomfort in bilateral upper arms and kind of the shoulder area.  No rash there.  No significant neurodeficit there.  Patient will continue her gabapentin x-ray of the left knee showed no bony abnormalities or any traumatic injury from the fall.  Patient has follow-up with primary care doctor on Thursday.  Patient has completed her course of valacyclovir.   Final Clinical Impression(s) / ED Diagnoses Final diagnoses:  Post-herpetic polyneuropathy    Rx / DC Orders ED Discharge Orders     None         Vanetta Mulders, MD 05/25/23 2001

## 2023-06-09 ENCOUNTER — Ambulatory Visit (INDEPENDENT_AMBULATORY_CARE_PROVIDER_SITE_OTHER): Payer: Federal, State, Local not specified - PPO | Admitting: Neurology

## 2023-06-09 ENCOUNTER — Encounter: Payer: Self-pay | Admitting: Neurology

## 2023-06-09 VITALS — BP 142/78 | HR 103 | Ht 61.0 in | Wt 261.0 lb

## 2023-06-09 DIAGNOSIS — M79604 Pain in right leg: Secondary | ICD-10-CM

## 2023-06-09 DIAGNOSIS — R292 Abnormal reflex: Secondary | ICD-10-CM | POA: Diagnosis not present

## 2023-06-09 DIAGNOSIS — B0229 Other postherpetic nervous system involvement: Secondary | ICD-10-CM | POA: Diagnosis not present

## 2023-06-09 MED ORDER — PREGABALIN 100 MG PO CAPS: 100.0000 mg | ORAL_CAPSULE | Freq: Three times a day (TID) | ORAL | 3 refills | Status: DC

## 2023-06-09 NOTE — Progress Notes (Signed)
[facility] Clinic Note - Initial Visit   Date: 06/09/2023   Monique Mata MRN: [id number] DOB: 07-20-1966   Dear Misty Stanley, PA-C:  Thank you for your kind referral of Monique Mata for consultation of right leg pain. Although her history is well known to you, please allow [place] to reiterate it for the purpose of our medical record. The patient was accompanied to the clinic by self.    Monique Mata is a 57 y.o. right-handed female with hypertension and hyperlipidemia presenting for evaluation of right leg pain.   IMPRESSION/PLAN: Right leg paresthesias and pain.  She has history of herpes zoster affecting the right lower leg and foot, which could explain her numbness/burning pain in the foot.  However, I would expect it to cause medial thigh pain and hip pain.  She may have an overlapping lumbar pathology, especially with her brisk reflexes and slight weakness in the legs.  I will check MRI lumbar spine wwo contrast.  For pain, increase Lyrica to 100mg  TID.  She may also use lidocaine ointment  Return to clinic in 2-3 months  ------------------------------------------------------------- History of present illness: In May, she developed shingles over the right lower leg over the lateral surface and into the toes.   Around the same time, she began having stabbing, squeezing, and numbness over the anterolateral lower leg and dorsum of the foot. She has severe pain at night time and cannot even let the sheet touch her right foot because it stings and burns.  She previously tried gabapentin 300mg  three times daily which she titrated to 600mg  three times daily.  She did not appreciate any benefit.  Currently, she is Lyrica 50mg  three times daily.   She also complains of severe pain in the right hip, medial thigh, and groin.  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   Past Medical History:  Diagnosis Date   Asthma    more likely  to flair seasonally   Bronchitis    Hyperlipidemia    Hypertension    Right ankle swelling    Seasonal allergies    Shingles     Past Surgical History:  Procedure Laterality Date   ABDOMINAL HYSTERECTOMY     COLONOSCOPY WITH PROPOFOL N/A 08/06/2021   Procedure: COLONOSCOPY WITH PROPOFOL;  Surgeon: Tressia Danas, MD;  Location: [facility];  Service: Gastroenterology;  Laterality: N/A;   FOOT SURGERY     KNEE SURGERY     LAPAROSCOPIC GASTRIC BANDING     POLYPECTOMY  08/06/2021   Procedure: POLYPECTOMY;  Surgeon: Tressia Danas, MD;  Location: [facility];  Service: Gastroenterology;;     Medications:  Outpatient Encounter Medications as of 06/09/2023  Medication Sig   alclomethasone (ACLOVATE) 0.05 % ointment Apply topically.   atorvastatin (LIPITOR) 20 MG tablet Take 20 mg by mouth daily.   cetirizine (ZYRTEC) 10 MG tablet Take 10 mg by mouth daily as needed for allergies.    clobetasol ointment (TEMOVATE) 0.05 % Apply 1 application topically 2 (two) times daily.   desonide (DESOWEN) 0.05 % ointment Apply 1 application topically 2 (two) times daily.   hydrochlorothiazide (HYDRODIURIL) 12.5 MG tablet Take 12.5 mg by mouth every morning.   hydrOXYzine (ATARAX) 50 MG tablet TAKE 1 TO 2 TABLETS BY MOUTH EVERY NIGHT AT BEDTIME AS NEEDED   ibuprofen (ADVIL,MOTRIN) 800 MG tablet Take 800 mg by mouth every 8 (eight) hours as needed for headache or mild pain.   lisinopril (ZESTRIL) 20  MG tablet Take 20 mg by mouth daily.   ondansetron (ZOFRAN-ODT) 4 MG disintegrating tablet Take 4 mg by mouth every 8 (eight) hours as needed.   oxyCODONE-acetaminophen (PERCOCET/ROXICET) 5-325 MG tablet Take by mouth.   pregabalin (LYRICA) 25 MG capsule Take by mouth 3 (three) times daily. 2 capsules three times a day   [DISCONTINUED] albuterol (PROVENTIL HFA;VENTOLIN HFA) 108 (90 Base) MCG/ACT inhaler Inhale 2 puffs into the lungs every 6 (six) hours as needed for shortness of breath. (Patient not  taking: No sig reported)   [DISCONTINUED] albuterol (PROVENTIL) (2.5 MG/3ML) 0.083% nebulizer solution Take 3 mLs (2.5 mg total) by nebulization every 4 (four) hours as needed for wheezing or shortness of breath. (Patient not taking: No sig reported)   [DISCONTINUED] budesonide-formoterol (SYMBICORT) 160-4.5 MCG/ACT inhaler Inhale 2 puffs into the lungs every 12 (twelve) hours. (Patient not taking: Reported on 07/30/2021)   [DISCONTINUED] ergocalciferol (VITAMIN D2) 1.25 MG (50000 UT) capsule Take 50,000 Units by mouth every Monday. (Patient not taking: Reported on 06/09/2023)   No facility-administered encounter medications on file as of 06/09/2023.    Allergies:  Allergies  Allergen Reactions   Fish Allergy Anaphylaxis   Peanut-Containing Drug Products Anaphylaxis   Penicillins Anaphylaxis and Other (See Comments)    Did it involve swelling of the face/tongue/throat, SOB, or low BP? Yes Did it involve sudden or severe rash/hives, skin peeling, or any reaction on the inside of your mouth or nose? Yes Did you need to seek medical attention at a hospital or doctor's office? Yes When did it last happen?    childhood   If all above answers are "NO", may proceed with cephalosporin use.    Triptans Other (See Comments)    unk   Iodine Solution [Povidone Iodine] Rash    Family History: Family History  Problem Relation Age of Onset   Cancer Mother    Asthma Father    Hypertension Father    Esophageal cancer Paternal Grandfather 60   Colon polyps Neg Hx    Colon cancer Neg Hx    Rectal cancer Neg Hx    Stomach cancer Neg Hx     Social History: Social History   Tobacco Use   Smoking status: Never   Smokeless tobacco: Never  Vaping Use   Vaping Use: Never used  Substance Use Topics   Alcohol use: Yes    Comment: occasional   Drug use: Never   Social History   Social History Narrative   Are you right handed or left handed? Right Handed   Are you currently employed ? 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 No    Who lives with you? Daughter and two brothers    What type of home do you live in: 1 story or 2 story? Lives in a one story home         Vital Signs:  BP (!) 142/78   Pulse (!) 103   Ht 5\' 1"  (1.549 m)   Wt 261 lb (118.4 kg)   BMI 49.32 kg/m   Neurological Exam: MENTAL STATUS including orientation to time, place, person, recent and remote memory, attention span and concentration, language, and fund of knowledge is normal.  Speech is not dysarthric.  CRANIAL NERVES: II:  No visual field defects.     III-IV-VI: Pupils equal round and reactive to light.  Normal conjugate, extra-ocular eye movements in all directions of gaze.  No nystagmus.  No ptosis.   V:  Normal facial sensation.    VII:  Normal facial symmetry and movements.   VIII:  Normal hearing and vestibular function.   IX-X:  Normal palatal movement.   XI:  Normal shoulder shrug and head rotation.   XII:  Normal tongue strength and range of motion, no deviation or fasciculation.  MOTOR:  No atrophy, fasciculations or abnormal movements.  No pronator drift.   Upper Extremity:  Right  Left  Deltoid  5/5   5/5   Biceps  5/5   5/5   Triceps  5/5   5/5   Finger extensors  5/5   5/5   Finger flexors  5/5   5/5   Dorsal interossei  5/5   5/5   Abductor pollicis  5/5   5/5   Tone (Ashworth scale)  0  0   Lower Extremity:  Right  Left  Hip flexors  5/5   5/5   Knee flexors  5/5   5/5   Knee extensors  5-/5   5/5   Dorsiflexors  5-/5   5/5   Plantarflexors  5/5   5/5   Toe extensors  5-/5   5/5   Toe flexors  5/5   5/5   Tone (Ashworth scale)  0  0   MSRs:                                           Right        Left brachioradialis 2+  2+  biceps 2+  2+  triceps 2+  2+  patellar 3+  3+  ankle jerk 2+  2+  Hoffman no  no  plantar response down  down   SENSORY:  Hyperesthesia to vibration at the right great toe, diminished pin prick and  temperature over the anterolateral right leg and dorsum of the foot.  Sensation intact above the right knee, left leg, and hands.    COORDINATION/GAIT: Normal finger-to- nose-finger.  Intact rapid alternating movements bilaterally. Gait is antalgic, assisted with cane, slow.      Thank you for allowing me to participate in patient's care.  If I can answer any additional questions, I would be pleased to do so.    Sincerely,    Archita Lomeli K. Allena Katz, DO

## 2023-06-09 NOTE — Patient Instructions (Addendum)
MRI lumbar spine wwo contrast  Increase Lyrica to 100mg  three times daily  Return to clinic in 2-3 months

## 2023-06-19 ENCOUNTER — Ambulatory Visit: Admission: RE | Admit: 2023-06-19 | Payer: Federal, State, Local not specified - PPO | Source: Ambulatory Visit

## 2023-06-19 DIAGNOSIS — M79604 Pain in right leg: Secondary | ICD-10-CM

## 2023-06-19 DIAGNOSIS — R292 Abnormal reflex: Secondary | ICD-10-CM

## 2023-06-19 DIAGNOSIS — B0229 Other postherpetic nervous system involvement: Secondary | ICD-10-CM

## 2023-06-19 MED ORDER — GADOPICLENOL 0.5 MMOL/ML IV SOLN
10.0000 mL | Freq: Once | INTRAVENOUS | Status: AC | PRN
  Administered 2023-06-19: 10 mL via INTRAVENOUS

## 2023-07-01 ENCOUNTER — Telehealth: Payer: Self-pay | Admitting: Neurology

## 2023-07-01 ENCOUNTER — Other Ambulatory Visit: Payer: Self-pay

## 2023-07-01 DIAGNOSIS — M79604 Pain in right leg: Secondary | ICD-10-CM

## 2023-07-01 DIAGNOSIS — R202 Paresthesia of skin: Secondary | ICD-10-CM

## 2023-07-01 NOTE — Telephone Encounter (Signed)
The patient said the strength of lyrica she is on is not helping her. Would like to know what can be done

## 2023-07-02 NOTE — Telephone Encounter (Signed)
Called patient and left a message for a call back.  

## 2023-07-02 NOTE — Telephone Encounter (Signed)
We can increase Lyrica to 150mg  three times daily. Updated prescription can be sent, if she would like to do this.  The maximum dose of Lyrica is 200mg  three times daily, so there is room to increase it.

## 2023-07-03 MED ORDER — PREGABALIN 150 MG PO CAPS: 150.0000 mg | ORAL_CAPSULE | Freq: Three times a day (TID) | ORAL | 3 refills | Status: AC

## 2023-07-03 MED ORDER — PREGABALIN 150 MG PO CAPS: 150.0000 mg | ORAL_CAPSULE | Freq: Three times a day (TID) | ORAL | 3 refills | Status: DC

## 2023-07-03 NOTE — Telephone Encounter (Signed)
Patient said her RX was sent to the wrong pharmacy. Needs to be sent to [facility] [address]

## 2023-07-03 NOTE — Telephone Encounter (Signed)
Yes, she may still proceed with having EMG.  Rx sent.

## 2023-07-03 NOTE — Telephone Encounter (Signed)
Called patient and informed her that it will be ok to proceed with the EMG. Patient verbalized understanding and had no further questions or concerns.

## 2023-07-03 NOTE — Telephone Encounter (Signed)
Sent as requested.  Please call [facility] on [address] to cancel the Rx.

## 2023-07-03 NOTE — Telephone Encounter (Signed)
Patient called back , she would like the prescription sent /KB

## 2023-07-03 NOTE — Addendum Note (Signed)
Addended by: Glendale Chard on: 07/03/2023 02:14 PM   Modules accepted: Orders

## 2023-07-03 NOTE — Telephone Encounter (Signed)
Called [facility] [address] and canceled Lyrica prescription.

## 2023-07-03 NOTE — Telephone Encounter (Signed)
Called patient and informed her of Dr. Eliane Decree recommendations. Patient would like to increase her Lyrica to 150 mg three times a day and would like a updated Rx sent in. Patient also stated that 2 weeks ago a blood clot was found and she is currently being treated for that. She is taking Eliqus and follows up in 3 months for an ultrasound to see if it has resolved. Patient wanted to know if getting the EMG in August would be ok with her blood clot issue?

## 2023-07-22 ENCOUNTER — Other Ambulatory Visit: Payer: Federal, State, Local not specified - PPO

## 2023-07-24 ENCOUNTER — Ambulatory Visit: Payer: Federal, State, Local not specified - PPO | Admitting: Neurology

## 2023-07-24 DIAGNOSIS — M79604 Pain in right leg: Secondary | ICD-10-CM

## 2023-07-24 DIAGNOSIS — R202 Paresthesia of skin: Secondary | ICD-10-CM

## 2023-07-24 NOTE — Procedures (Signed)
  [facility]  [address] [place], [address]  [place], [place] [address] Tel: [phone] Fax: [phone] Test Date:  07/24/2023  Patient: Monique Mata DOB: 10-28-1966 Physician: Nita Sickle, DO  Sex: Female Height: 5\' 1"  Ref Phys: Nita Sickle, DO  ID#: [id number]   Technician:    History: This is a 57 year old female referred for evaluation of right leg pain and paresthesias.  Impression: This is an incomplete study as testing was terminated due to pain.   ___________________________ Nita Sickle, DO    Nerve Conduction Studies   Stim Site NR Peak (ms) Norm Peak (ms) O-P Amp (V) Norm O-P Amp  Right Sup Peroneal Anti Sensory (Ant Lat Mall)  32 C  12 cm    3.5 <4.6 5.2 >4     Stim Site NR Onset (ms) Norm Onset (ms) O-P Amp (mV) Norm O-P Amp Site1 Site2 Delta-0 (ms) Dist (cm) Vel (m/s) Norm Vel (m/s)  Right Peroneal Motor (Ext Dig Brev)  32 C  Ankle    3.2 <6.0 *1.3 >2.5 B Fib Ankle 10.6 0.0  >40  B Fib    13.8  0.1  Poplt B Fib 0.0 0.0  >40  Poplt    13.8  0.2             Waveforms:

## 2023-07-28 ENCOUNTER — Other Ambulatory Visit: Payer: Self-pay

## 2023-07-28 DIAGNOSIS — M79604 Pain in right leg: Secondary | ICD-10-CM

## 2023-08-11 ENCOUNTER — Other Ambulatory Visit: Payer: Self-pay | Admitting: Medical Genetics

## 2023-08-11 DIAGNOSIS — Z006 Encounter for examination for normal comparison and control in clinical research program: Secondary | ICD-10-CM

## 2023-08-24 ENCOUNTER — Ambulatory Visit: Payer: Federal, State, Local not specified - PPO

## 2023-09-29 ENCOUNTER — Ambulatory Visit: Payer: Federal, State, Local not specified - PPO | Admitting: Neurology

## 2024-09-01 ENCOUNTER — Ambulatory Visit: Admitting: Family Medicine

## 2024-09-01 ENCOUNTER — Other Ambulatory Visit: Payer: Self-pay

## 2024-09-01 ENCOUNTER — Encounter: Payer: Self-pay | Admitting: Family Medicine

## 2024-09-01 VITALS — BP 130/84 | HR 78 | Ht 61.0 in | Wt 254.0 lb

## 2024-09-01 DIAGNOSIS — G8929 Other chronic pain: Secondary | ICD-10-CM | POA: Diagnosis not present

## 2024-09-01 DIAGNOSIS — M25561 Pain in right knee: Secondary | ICD-10-CM | POA: Diagnosis not present

## 2024-09-01 NOTE — Progress Notes (Unsigned)
 I, Monique Mata, CMA acting as a scribe for Monique Lloyd, MD.  Monique Mata is a 58 y.o. female who presents to [facility] today for R knee pain x 1 week. FNP:wnwz. Pt locates pain to distal to joint line. Sx causing night disturbance. 6-8/10. Minimal relief with ice, IBU.   R Knee swelling:  denies Mechanical symptoms: feels unstable at times Aggravates: Knee flexion, standing Treatments tried: IBU, ice  Dx testing: 05/25/23 R knee XR  Pertinent review of systems: No fevers or chills  Relevant historical information: Asthma, obesity   Exam:  BP 130/84   Pulse 78   Ht 5' 1 (1.549 m)   Wt 254 lb (115.2 kg)   SpO2 99%   BMI 47.99 kg/m  General: Well Developed, well nourished, and in no acute distress.   MSK: Right knee mild effusion.  Normal-appearing otherwise.  Normal motion. Tender palpation posterior lateral knee and anterior medial joint line.  Stable ligamentous exam. Intact strength.     Lab and Radiology Results  Procedure: Real-time Ultrasound Guided Injection of right knee joint superior lateral patella space Device: Philips Affiniti 50G/GE Logiq Images permanently stored and available for review in PACS Verbal informed consent obtained.  Discussed risks and benefits of procedure. Warned about infection, bleeding, hyperglycemia damage to structures among others. Patient expresses understanding and agreement Time-out conducted.   Noted no overlying erythema, induration, or other signs of local infection.   Skin prepped in a sterile fashion.   Local anesthesia: Topical Ethyl chloride.   With sterile technique and under real time ultrasound guidance: 40 mg of Kenalog and 2 mL of Marcaine injected into knee joint. Fluid seen entering the joint capsule.   Completed without difficulty   Pain immediately resolved suggesting accurate placement of the medication.   Advised to call if fevers/chills, erythema, induration,  drainage, or persistent bleeding.   Images permanently stored and available for review in the ultrasound unit.  Impression: Technically successful ultrasound guided injection.    EXAM: RIGHT KNEE - COMPLETE 4+ VIEW   COMPARISON:  None Available.   FINDINGS: No fracture or malalignment. No sizable knee effusion. There are mild degenerative changes   IMPRESSION: No acute osseous abnormality.     Electronically Signed   By: Luke Bun M.D.   On: 05/25/2023 19:14 I, Monique Mata, personally (independently) visualized and performed the interpretation of the images attached in this note.    Assessment and Plan: 58 y.o. 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  Plan for steroid injection today.  Additionally refer to physical therapy and recommend Voltaren gel.  Recheck back if not improving.   PDMP not reviewed this encounter. Orders Placed This Encounter  Procedures   US  LIMITED JOINT SPACE STRUCTURES LOW RIGHT(NO LINKED CHARGES)    Reason for Exam (SYMPTOM  OR DIAGNOSIS REQUIRED):   right knee pain    Preferred imaging location?:   [facility] referral to Physical Therapy    Referral Priority:   Routine    Referral Type:   Physical Medicine    Referral Reason:   Specialty Services Required    Requested Specialty:   Physical Therapy    Number of Visits Requested:   1   No orders of the defined types were placed in this encounter.    Discussed warning signs or symptoms. Please see discharge instructions. Patient expresses understanding.   The  above documentation has been reviewed and is accurate and complete Monique Mata, M.D.

## 2024-09-01 NOTE — Patient Instructions (Addendum)
 Thank you for coming in today.   You received an injection today. Seek immediate medical attention if the joint becomes red, extremely painful, or is oozing fluid.   A referral for physical therapy has been submitted. A representative from the physical therapy office will contact you to coordinate scheduling after confirming your benefits with your insurance provider. If you do not hear from the physical therapy office within the next 1-2 weeks, please let us  know.   See you back as needed.

## 2024-10-03 ENCOUNTER — Other Ambulatory Visit: Payer: Self-pay | Admitting: Medical Genetics

## 2024-10-03 DIAGNOSIS — Z006 Encounter for examination for normal comparison and control in clinical research program: Secondary | ICD-10-CM
